# Patient Record
Sex: Female | Born: 1985
Health system: Southern US, Community
[De-identification: ages and names within clinical notes are randomized; demographics above are authoritative.]

## PROBLEM LIST (undated history)

## (undated) DIAGNOSIS — G44229 Chronic tension-type headache, not intractable: Secondary | ICD-10-CM

## (undated) DIAGNOSIS — Z950 Presence of cardiac pacemaker: Secondary | ICD-10-CM

## (undated) DIAGNOSIS — K589 Irritable bowel syndrome without diarrhea: Secondary | ICD-10-CM

## (undated) DIAGNOSIS — Q203 Discordant ventriculoarterial connection: Secondary | ICD-10-CM

## (undated) DIAGNOSIS — Z8774 Personal history of (corrected) congenital malformations of heart and circulatory system: Secondary | ICD-10-CM

## (undated) DIAGNOSIS — G4733 Obstructive sleep apnea (adult) (pediatric): Secondary | ICD-10-CM

## (undated) DIAGNOSIS — N390 Urinary tract infection, site not specified: Secondary | ICD-10-CM

## (undated) DIAGNOSIS — A6920 Lyme disease, unspecified: Secondary | ICD-10-CM

## (undated) HISTORY — PX: CHOLECYSTECTOMY: SHX55

## (undated) HISTORY — PX: CARDIAC SURGERY: SHX584

---

## 2014-01-25 ENCOUNTER — Ambulatory Visit: Payer: Self-pay | Admitting: Family Medicine

## 2014-01-25 LAB — URINALYSIS, COMPLETE

## 2014-01-25 LAB — PREGNANCY, URINE: Pregnancy Test, Urine: NEGATIVE m[IU]/mL

## 2014-01-27 LAB — URINE CULTURE

## 2014-02-04 ENCOUNTER — Ambulatory Visit: Payer: Self-pay

## 2014-02-04 LAB — URINALYSIS, COMPLETE
Bilirubin,UR: NEGATIVE
Blood: NEGATIVE
Glucose,UR: NEGATIVE
Ketone: NEGATIVE
LEUKOCYTE ESTERASE: NEGATIVE
Nitrite: NEGATIVE
Ph: 5.5 (ref 5.0–8.0)
Protein: NEGATIVE
Specific Gravity: 1.025 (ref 1.000–1.030)

## 2014-02-04 LAB — PREGNANCY, URINE: PREGNANCY TEST, URINE: NEGATIVE m[IU]/mL

## 2014-02-06 LAB — URINE CULTURE

## 2014-02-15 ENCOUNTER — Ambulatory Visit: Payer: Self-pay

## 2014-04-02 ENCOUNTER — Ambulatory Visit: Payer: Self-pay | Admitting: Family Medicine

## 2014-11-22 ENCOUNTER — Ambulatory Visit
Admission: EM | Admit: 2014-11-22 | Discharge: 2014-11-22 | Disposition: A | Discharge status: Home / Self Care | Payer: Managed Care, Other (non HMO) | Attending: Family Medicine | Admitting: Family Medicine

## 2014-11-22 DIAGNOSIS — N390 Urinary tract infection, site not specified: Secondary | ICD-10-CM | POA: Diagnosis not present

## 2014-11-22 HISTORY — DX: Presence of cardiac pacemaker: Z95.0

## 2014-11-22 HISTORY — DX: Lyme disease, unspecified: A69.20

## 2014-11-22 LAB — URINALYSIS COMPLETE WITH MICROSCOPIC (ARMC ONLY)
Bilirubin Urine: NEGATIVE
Glucose, UA: NEGATIVE mg/dL
KETONES UR: NEGATIVE mg/dL
Nitrite: NEGATIVE
Protein, ur: 100 mg/dL — AB
Specific Gravity, Urine: 1.025 (ref 1.005–1.030)
pH: 6.5 (ref 5.0–8.0)

## 2014-11-22 LAB — PREGNANCY, URINE: Preg Test, Ur: POSITIVE — AB

## 2014-11-22 MED ORDER — NITROFURANTOIN MONOHYD MACRO 100 MG PO CAPS: 100.0000 mg | ORAL_CAPSULE | Freq: Two times a day (BID) | ORAL | Status: AC

## 2014-11-22 NOTE — ED Notes (Signed)
Pt states "I had a c-section two weeks ago and have urinary pain since, it has gotten worse in last few days, very painful."

## 2014-11-22 NOTE — ED Provider Notes (Signed)
Patient presents today 2 weeks postpartum status post C-section with suprapubic pain that has gotten worse in the last few days. Patient states that the incision feels fine and looks fine. She has had some urinary frequency and dysuria. She does have a history of UTIs. She denies any fever or chills or any nausea or vomiting. She is breast-feeding. She denies being sexually active since her C-section.  ROS: Negative except mentioned above. Vitals as per Epic.   GENERAL: NAD RESP: CTA B CARD: RRR ABD: +BS, mild suprapubic tenderness, no focal area of tenderness, no erythema or drainage from incision, no flank tenderness NEURO: CN II-XII grossly intact   A/P: UTI- UA suggests UTI. Will send urine for culture. HCG is likely positive and trending down due to recent pregnancy. Patient denies being sexually active since delivering. Will treat patient with Macrobid. Patient is to follow-up with OB/GYN or our office if symptoms do persist or worsen as discussed.    Jolene Provost, MD 11/22/14 [phone]

## 2014-11-25 LAB — URINE CULTURE: Culture: >=100000

## 2015-01-01 ENCOUNTER — Ambulatory Visit
Admission: EM | Admit: 2015-01-01 | Discharge: 2015-01-01 | Disposition: A | Discharge status: Home / Self Care | Payer: Managed Care, Other (non HMO) | Attending: Family Medicine | Admitting: Family Medicine

## 2015-01-01 ENCOUNTER — Encounter: Payer: Self-pay | Admitting: *Deleted

## 2015-01-01 DIAGNOSIS — N390 Urinary tract infection, site not specified: Secondary | ICD-10-CM

## 2015-01-01 LAB — URINALYSIS COMPLETE WITH MICROSCOPIC (ARMC ONLY)
GLUCOSE, UA: NEGATIVE mg/dL
NITRITE: NEGATIVE
Protein, ur: 30 mg/dL — AB
SPECIFIC GRAVITY, URINE: 1.025 (ref 1.005–1.030)
pH: 5.5 (ref 5.0–8.0)

## 2015-01-01 MED ORDER — PHENAZOPYRIDINE HCL 200 MG PO TABS: 200.0000 mg | ORAL_TABLET | Freq: Three times a day (TID) | ORAL | Status: DC | PRN

## 2015-01-01 MED ORDER — CIPROFLOXACIN HCL 500 MG PO TABS: 500.0000 mg | ORAL_TABLET | Freq: Two times a day (BID) | ORAL | Status: DC

## 2015-01-01 MED ORDER — FLUCONAZOLE 150 MG PO TABS: 150.0000 mg | ORAL_TABLET | Freq: Once | ORAL | Status: DC

## 2015-01-01 NOTE — ED Notes (Signed)
Pt states that she has flank pain, both sides, mainly on the left side, started about 1 week ago, burning during urination started today

## 2015-01-01 NOTE — Discharge Instructions (Signed)
Dysuria °Dysuria is pain or discomfort while urinating. The pain or discomfort may be felt in the tube that carries urine out of the bladder (urethra) or in the surrounding tissue of the genitals. The pain may also be felt in the groin area, lower abdomen, and lower back. You may have to urinate frequently or have the sudden feeling that you have to urinate (urgency). Dysuria can affect both men and women, but is more common in women. °Dysuria can be caused by many different things, including: °· Urinary tract infection in women. °· Infection of the kidney or bladder. °· Kidney stones or bladder stones. °· Certain sexually transmitted infections (STIs), such as chlamydia. °· Dehydration. °· Inflammation of the vagina. °· Use of certain medicines. °· Use of certain soaps or scented products that cause irritation. °HOME CARE INSTRUCTIONS °Watch your dysuria for any changes. The following actions may help to reduce any discomfort you are feeling: °· Drink enough fluid to keep your urine clear or pale yellow. °· Empty your bladder often. Avoid holding urine for long periods of time. °· After a bowel movement or urination, women should cleanse from front to back, using each tissue only once. °· Empty your bladder after sexual intercourse. °· Take medicines only as directed by your health care provider. °· If you were prescribed an antibiotic medicine, finish it all even if you start to feel better. °· Avoid caffeine, tea, and alcohol. They can irritate the bladder and make dysuria worse. In men, alcohol may irritate the prostate. °· Keep all follow-up visits as directed by your health care provider. This is important. °· If you had any tests done to find the cause of dysuria, it is your responsibility to obtain your test results. Ask the lab or department performing the test when and how you will get your results. Talk with your health care provider if you have any questions about your results. °SEEK MEDICAL CARE  IF: °· You develop pain in your back or sides. °· You have a fever. °· You have nausea or vomiting. °· You have blood in your urine. °· You are not urinating as often as you usually do. °SEEK IMMEDIATE MEDICAL CARE IF: °· You pain is severe and not relieved with medicines. °· You are unable to hold down any fluids. °· You or someone else notices a change in your mental function. °· You have a rapid heartbeat at rest. °· You have shaking or chills. °· You feel extremely weak. °  °This information is not intended to replace advice given to you by your health care provider. Make sure you discuss any questions you have with your health care provider. °  °Document Released: 11/02/2003 Document Revised: 02/24/2014 Document Reviewed: 09/29/2013 °Elsevier Interactive Patient Education ©2016 [employer] ° °Urinary Tract Infection °A urinary tract infection (UTI) can occur any place along the urinary tract. The tract includes the kidneys, ureters, bladder, and urethra. A type of germ called bacteria often causes a UTI. UTIs are often helped with antibiotic medicine.  °HOME CARE  °· If given, take antibiotics as told by your doctor. Finish them even if you start to feel better. °· Drink enough fluids to keep your pee (urine) clear or pale yellow. °· Avoid tea, drinks with caffeine, and bubbly (carbonated) drinks. °· Pee often. Avoid holding your pee in for a long time. °· Pee before and after having sex (intercourse). °· Wipe from front to back after you poop (bowel movement) if you are a   woman. Use each tissue only once. °GET HELP RIGHT AWAY IF:  °· You have back pain. °· You have lower belly (abdominal) pain. °· You have chills. °· You feel sick to your stomach (nauseous). °· You throw up (vomit). °· Your burning or discomfort with peeing does not go away. °· You have a fever. °· Your symptoms are not better in 3 days. °MAKE SURE YOU:  °· Understand these instructions. °· Will watch your condition. °· Will get help right  away if you are not doing well or get worse. °  °This information is not intended to replace advice given to you by your health care provider. Make sure you discuss any questions you have with your health care provider. °  °Document Released: 07/23/2007 Document Revised: 02/24/2014 Document Reviewed: 09/04/2011 °Elsevier Interactive Patient Education ©2016 [employer] ° °

## 2015-01-01 NOTE — ED Provider Notes (Signed)
CSN: [id number]     Arrival date & time 01/01/15  1747 History   First MD Initiated Contact with Patient 01/01/15 1816     Nurses notes were reviewed. Chief Complaint  Patient presents with  . Flank Pain  . Dysuria    Patient is here because of a symptoms of UTI. She states for about a week she's had symptoms of UTI but then the last 2448 hrs. she started having back pain and increased discomfort with dysuria. About a month ago she had a significant UTI that grew out both Escherichia coli and Proteus. She states that she for a while but symptoms seem to recur quickly. She's recently had a baby about 2 weeks ago and she has started having a period and has started becoming sexually active again. She states she's had a history of UTIs in the past as well. (Consider location/radiation/quality/duration/timing/severity/associated sxs/prior Treatment) Patient is a 29 y.o. female presenting with flank pain and dysuria. The history is provided by the patient. No language interpreter was used.  Flank Pain This is a new problem. The current episode started more than 1 week ago. The problem occurs constantly. The problem has been gradually worsening. Associated symptoms include abdominal pain. Pertinent negatives include no chest pain, no headaches and no shortness of breath. She has tried nothing for the symptoms.  Dysuria Pain quality:  Burning Pain severity:  Moderate Duration:  1 week Timing:  Constant Progression:  Worsening Chronicity:  New Relieved by:  Nothing Ineffective treatments:  None tried Urinary symptoms: frequent urination   Associated symptoms: abdominal pain and flank pain   Risk factors: recurrent urinary tract infections and sexually active   Risk factors: no hx of pyelonephritis, no hx of urolithiasis, no kidney transplant, not pregnant, no renal disease and no urinary catheter     Past Medical History  Diagnosis Date  . Pacemaker   . Lyme disease    Past Surgical History   Procedure Laterality Date  . Cholecystectomy    . Cardiac surgery     No family history on file. Social History  Substance Use Topics  . Smoking status: Never Smoker   . Smokeless tobacco: None  . Alcohol Use: No   OB History    No data available     Review of Systems  Respiratory: Negative for shortness of breath.   Cardiovascular: Negative for chest pain.       Because patient has a pacemaker which she states she first was placed when she was 7 this is her second pacemaker and make sure she is having her third pacemaker installed.  Gastrointestinal: Positive for abdominal pain.  Genitourinary: Positive for dysuria and flank pain.  Neurological: Negative for headaches.  All other systems reviewed and are negative.   Allergies  Latex and Vicodin  Home Medications   Prior to Admission medications   Medication Sig Start Date End Date Taking? Authorizing Provider  acetaminophen (TYLENOL) 500 MG tablet Take 1,000 mg by mouth every 6 (six) hours as needed.    Historical Provider, MD  ciprofloxacin (CIPRO) 500 MG tablet Take 1 tablet (500 mg total) by mouth 2 (two) times daily. 01/01/15   Hassan RowanEugene Greysen Devino, MD  fluconazole (DIFLUCAN) 150 MG tablet Take 1 tablet (150 mg total) by mouth once. 01/01/15   Hassan RowanEugene Tamura Lasky, MD  phenazopyridine (PYRIDIUM) 200 MG tablet Take 1 tablet (200 mg total) by mouth 3 (three) times daily as needed for pain. 01/01/15   Hassan RowanEugene Jaycee Mckellips, MD  Meds Ordered and Administered this Visit  Medications - No data to display  BP 113/74 mmHg  Pulse 82  Temp(Src) 97.9 F (36.6 C) (Oral)  Ht  (1.651 m)  Wt 175 lb (79.379 kg)  BMI 29.12 kg/m2  SpO2 98%  LMP 12/23/2014 No data found.   Physical Exam  Constitutional: She is oriented to person, place, and time. She appears well-developed and well-nourished.  HENT:  Head: Normocephalic and atraumatic.  Eyes: Conjunctivae are normal. Pupils are equal, round, and reactive to light.  Abdominal: Soft. Bowel  sounds are normal. She exhibits no distension. There is tenderness in the suprapubic area. There is no CVA tenderness. No hernia.  Musculoskeletal: Normal range of motion.  Neurological: She is oriented to person, place, and time.  Skin: Skin is warm and dry. No rash noted. No erythema.  Psychiatric: She has a normal mood and affect.  Vitals reviewed.   ED Course  Procedures (including critical care time)  Labs Review Labs Reviewed  URINALYSIS COMPLETEWITH MICROSCOPIC ([facility] ONLY) - Abnormal; Notable for the following:    Color, Urine AMBER (*)    APPearance CLOUDY (*)    Bilirubin Urine 1+ (*)    Ketones, ur TRACE (*)    Hgb urine dipstick 3+ (*)    Protein, ur 30 (*)    Leukocytes, UA 2+ (*)    Bacteria, UA MANY (*)    Squamous Epithelial / LPF 6-30 (*)    All other components within normal limits  URINE CULTURE    Imaging Review No results found.   Visual Acuity Review  Right Eye Distance:   Left Eye Distance:   Bilateral Distance:    Right Eye Near:   Left Eye Near:    Bilateral Near:         MDM   1. UTI (lower urinary tract infection)      Due to her recurrence of UTI she states that normally it takes about 8 or 9 days for which she starts feeling the effect of antibiotics she requested Dr. prolonged course we'll place on 15 days. Also Diflucan will be given and Pyridium for the symptoms. Patient's no longer breast-feeding. Also patient is following the proper things such as wiping down going to this bathroom before sexual relations and she states she is gross afterwards and she takes a shower she is sexually active now 29 weeks old. She declined work note. And as far as the blood she states that she is on her period and would also explain the hematuria that was present in the urine as well.     Hassan Rowan, MD 01/01/15 [id number]

## 2015-01-04 LAB — URINE CULTURE: Special Requests: NORMAL

## 2015-01-04 NOTE — ED Notes (Signed)
Final report of urine C&S shows e-coli, sensitive to rx provided day of visit. Called and left message for patient to complete Rx as written, and to call or return for any questions or problems

## 2015-03-14 ENCOUNTER — Ambulatory Visit
Admission: EM | Admit: 2015-03-14 | Discharge: 2015-03-14 | Disposition: A | Discharge status: Home / Self Care | Payer: Managed Care, Other (non HMO) | Attending: Family Medicine | Admitting: Family Medicine

## 2015-03-14 ENCOUNTER — Encounter: Payer: Self-pay | Admitting: Emergency Medicine

## 2015-03-14 DIAGNOSIS — J0100 Acute maxillary sinusitis, unspecified: Secondary | ICD-10-CM

## 2015-03-14 DIAGNOSIS — H6501 Acute serous otitis media, right ear: Secondary | ICD-10-CM | POA: Diagnosis not present

## 2015-03-14 LAB — RAPID INFLUENZA A&B ANTIGENS: Influenza A (ARMC): NOT DETECTED

## 2015-03-14 LAB — RAPID INFLUENZA A&B ANTIGENS (ARMC ONLY): INFLUENZA B (ARMC): NOT DETECTED

## 2015-03-14 MED ORDER — AMOXICILLIN 400 MG/5ML PO SUSR: ORAL | Status: DC

## 2015-03-14 NOTE — ED Notes (Addendum)
Patient c/o cough, chest congestion, HAs, sinus pain and pressure, and bodyaches that started 2 days ago.  Patient denies fevers.

## 2015-03-14 NOTE — ED Provider Notes (Signed)
CSN: [id number]     Arrival date & time 03/14/15  1020 History   First MD Initiated Contact with Patient 03/14/15 1126     Chief Complaint  Patient presents with  . Cough  . Headache  . Facial Pain   (Consider location/radiation/quality/duration/timing/severity/associated sxs/prior Treatment) Patient is a 30 y.o. female presenting with cough, headaches, and URI. The history is provided by the patient.  Cough Associated symptoms: ear pain, fever and headaches   Headache Associated symptoms: congestion, cough, ear pain, facial pain, fever and URI   URI Presenting symptoms: congestion, cough, ear pain, facial pain and fever   Severity:  Moderate Onset quality:  Sudden Duration:  3 weeks Timing:  Constant Progression:  Worsening Chronicity:  New Relieved by:  Nothing Ineffective treatments:  OTC medications Associated symptoms: headaches and sinus pain   Risk factors: not elderly, no chronic respiratory disease, no diabetes mellitus, no immunosuppression, no recent illness, no recent travel and no sick contacts     Past Medical History  Diagnosis Date  . Pacemaker   . Lyme disease    Past Surgical History  Procedure Laterality Date  . Cholecystectomy    . Cardiac surgery    . Cesarean section     History reviewed. No pertinent family history. Social History  Substance Use Topics  . Smoking status: Never Smoker   . Smokeless tobacco: None  . Alcohol Use: No   OB History    No data available     Review of Systems  Constitutional: Positive for fever.  HENT: Positive for congestion and ear pain.   Respiratory: Positive for cough.   Neurological: Positive for headaches.    Allergies  Latex and Vicodin  Home Medications   Prior to Admission medications   Medication Sig Start Date End Date Taking? Authorizing Provider  acetaminophen (TYLENOL) 500 MG tablet Take 1,000 mg by mouth every 6 (six) hours as needed.    Historical Provider, MD  amoxicillin (AMOXIL) 400  MG/5ML suspension 10 ml po bid for 10 days 03/14/15   Payton Mccallum, MD  ciprofloxacin (CIPRO) 500 MG tablet Take 1 tablet (500 mg total) by mouth 2 (two) times daily. 01/01/15   Hassan Rowan, MD  fluconazole (DIFLUCAN) 150 MG tablet Take 1 tablet (150 mg total) by mouth once. 01/01/15   Hassan Rowan, MD  phenazopyridine (PYRIDIUM) 200 MG tablet Take 1 tablet (200 mg total) by mouth 3 (three) times daily as needed for pain. 01/01/15   Hassan Rowan, MD   Meds Ordered and Administered this Visit  Medications - No data to display  BP 119/72 mmHg  Pulse 89  Temp(Src) 98.1 F (36.7 C) (Tympanic)  Resp 16  Ht  (1.651 m)  Wt 185 lb (83.915 kg)  BMI 30.79 kg/m2  SpO2 99%  LMP 02/28/2015 (Approximate) No data found.   Physical Exam  Constitutional: She appears well-developed and well-nourished. No distress.  HENT:  Head: Normocephalic and atraumatic.  Right Ear: External ear and ear canal normal. Tympanic membrane is erythematous and bulging. A middle ear effusion is present.  Left Ear: Tympanic membrane, external ear and ear canal normal.  Nose: Mucosal edema and rhinorrhea present. No nose lacerations, sinus tenderness, nasal deformity, septal deviation or nasal septal hematoma. No epistaxis.  No foreign bodies. Right sinus exhibits maxillary sinus tenderness and frontal sinus tenderness. Left sinus exhibits maxillary sinus tenderness and frontal sinus tenderness.  Mouth/Throat: Uvula is midline, oropharynx is clear and moist and mucous membranes are normal.  No oropharyngeal exudate.  Eyes: Conjunctivae and EOM are normal. Pupils are equal, round, and reactive to light. Right eye exhibits no discharge. Left eye exhibits no discharge. No scleral icterus.  Neck: Normal range of motion. Neck supple. No thyromegaly present.  Cardiovascular: Normal rate, regular rhythm and normal heart sounds.   Pulmonary/Chest: Effort normal and breath sounds normal. No respiratory distress. She has no wheezes.  She has no rales.  Lymphadenopathy:    She has no cervical adenopathy.  Skin: She is not diaphoretic.  Nursing note and vitals reviewed.   ED Course  Procedures (including critical care time)  Labs Review Labs Reviewed  RAPID INFLUENZA A&B ANTIGENS [facility] ONLY)    Imaging Review No results found.   Visual Acuity Review  Right Eye Distance:   Left Eye Distance:   Bilateral Distance:    Right Eye Near:   Left Eye Near:    Bilateral Near:         MDM   1. Acute maxillary sinusitis, recurrence not specified   2. Right acute serous otitis media, recurrence not specified    New Prescriptions   AMOXICILLIN (AMOXIL) 400 MG/5ML SUSPENSION    10 ml po bid for 10 days   1.diagnosis reviewed with patient/parent/guardian/family 2. rx as per orders above; reviewed possible side effects, interactions, risks and benefits  3. Recommend supportive treatment with otc analgesics, increased fluids 4. Follow-up prn if symptoms worsen or don't improve    Payton Mccallum, MD 03/14/15 1153

## 2015-03-14 NOTE — Discharge Instructions (Signed)

## 2015-03-17 ENCOUNTER — Encounter: Payer: Self-pay | Admitting: Emergency Medicine

## 2015-03-17 ENCOUNTER — Ambulatory Visit
Admission: EM | Admit: 2015-03-17 | Discharge: 2015-03-17 | Disposition: A | Discharge status: Home / Self Care | Payer: Managed Care, Other (non HMO) | Attending: Family Medicine | Admitting: Family Medicine

## 2015-03-17 DIAGNOSIS — H6503 Acute serous otitis media, bilateral: Secondary | ICD-10-CM | POA: Diagnosis not present

## 2015-03-17 DIAGNOSIS — K529 Noninfective gastroenteritis and colitis, unspecified: Secondary | ICD-10-CM | POA: Diagnosis not present

## 2015-03-17 DIAGNOSIS — J0100 Acute maxillary sinusitis, unspecified: Secondary | ICD-10-CM | POA: Diagnosis not present

## 2015-03-17 MED ORDER — SUPRAX 400 MG PO TABS: 400.0000 mg | ORAL_TABLET | Freq: Every day | ORAL | Status: DC

## 2015-03-17 MED ORDER — HYDROCOD POLST-CPM POLST ER 10-8 MG/5ML PO SUER: 5.0000 mL | Freq: Two times a day (BID) | ORAL | Status: DC | PRN

## 2015-03-17 MED ORDER — BENZONATATE 200 MG PO CAPS: 200.0000 mg | ORAL_CAPSULE | Freq: Three times a day (TID) | ORAL | Status: DC | PRN

## 2015-03-17 MED ORDER — ONDANSETRON 8 MG PO TBDP: 8.0000 mg | ORAL_TABLET | Freq: Three times a day (TID) | ORAL | Status: DC | PRN

## 2015-03-17 NOTE — Discharge Instructions (Signed)
Serous Otitis Media Serous otitis media is fluid in the middle ear space. This space contains the bones for hearing and air. Air in the middle ear space helps to transmit sound.  The air gets there through the eustachian tube. This tube goes from the back of the nose (nasopharynx) to the middle ear space. It keeps the pressure in the middle ear the same as the outside world. It also helps to drain fluid from the middle ear space. CAUSES  Serous otitis media occurs when the eustachian tube gets blocked. Blockage can come from:  Ear infections.  Colds and other upper respiratory infections.  Allergies.  Irritants such as cigarette smoke.  Sudden changes in air pressure (such as descending in an airplane).  Enlarged adenoids.  A mass in the nasopharynx. During colds and upper respiratory infections, the middle ear space can become temporarily filled with fluid. This can happen after an ear infection also. Once the infection clears, the fluid will generally drain out of the ear through the eustachian tube. If it does not, then serous otitis media occurs. SIGNS AND SYMPTOMS   Hearing loss.  A feeling of fullness in the ear, without pain.  Young children may not show any symptoms but may show slight behavioral changes, such as agitation, ear pulling, or crying. DIAGNOSIS  Serous otitis media is diagnosed by an ear exam. Tests may be done to check on the movement of the eardrum. Hearing exams may also be done. TREATMENT  The fluid most often goes away without treatment. If allergy is the cause, allergy treatment may be helpful. Fluid that persists for several months may require minor surgery. A small tube is placed in the eardrum to:  Drain the fluid.  Restore the air in the middle ear space. In certain situations, antibiotic medicines are used to avoid surgery. Surgery may be done to remove enlarged adenoids (if this is the cause). HOME CARE INSTRUCTIONS   Keep children away from  tobacco smoke.  Keep all follow-up visits as directed by your health care provider. SEEK MEDICAL CARE IF:   Your hearing is not better in 3 months.  Your hearing is worse.  You have ear pain.  You have drainage from the ear.  You have dizziness.  You have serous otitis media only in one ear or have any bleeding from your nose (epistaxis).  You notice a lump on your neck. MAKE SURE YOU:  Understand these instructions.   Will watch your condition.   Will get help right away if you are not doing well or get worse.    This information is not intended to replace advice given to you by your health care provider. Make sure you discuss any questions you have with your health care provider.   Document Released: 04/26/2003 Document Revised: 02/24/2014 Document Reviewed: 08/31/2012 Elsevier Interactive Patient Education 2016 [employer]  Sinusitis, Adult Sinusitis is redness, soreness, and puffiness (inflammation) of the air pockets in the bones of your face (sinuses). The redness, soreness, and puffiness can cause air and mucus to get trapped in your sinuses. This can allow germs to grow and cause an infection.  HOME CARE   Drink enough fluids to keep your pee (urine) clear or pale yellow.  Use a humidifier in your home.  Run a hot shower to create steam in the bathroom. Sit in the bathroom with the door closed. Breathe in the steam 3-4 times a day.  Put a warm, moist washcloth on your face 3-4  times a day, or as told by your doctor.  Use salt water sprays (saline sprays) to wet the thick fluid in your nose. This can help the sinuses drain.  Only take medicine as told by your doctor. GET HELP RIGHT AWAY IF:   Your pain gets worse.  You have very bad headaches.  You are sick to your stomach (nauseous).  You throw up (vomit).  You are very sleepy (drowsy) all the time.  Your face is puffy (swollen).  Your vision changes.  You have a stiff neck.  You have  trouble breathing. MAKE SURE YOU:   Understand these instructions.  Will watch your condition.  Will get help right away if you are not doing well or get worse.   This information is not intended to replace advice given to you by your health care provider. Make sure you discuss any questions you have with your health care provider.   Document Released: 07/23/2007 Document Revised: 02/24/2014 Document Reviewed: 09/09/2011 Elsevier Interactive Patient Education 2016 [employer]   Upper Respiratory Infection, Adult Most upper respiratory infections (URIs) are caused by a virus. A URI affects the nose, throat, and upper air passages. The most common type of URI is often called "the common cold." HOME CARE   Take medicines only as told by your doctor.  Gargle warm saltwater or take cough drops to comfort your throat as told by your doctor.  Use a warm mist humidifier or inhale steam from a shower to increase air moisture. This may make it easier to breathe.  Drink enough fluid to keep your pee (urine) clear or pale yellow.  Eat soups and other clear broths.  Have a healthy diet.  Rest as needed.  Go back to work when your fever is gone or your doctor says it is okay.  You may need to stay home longer to avoid giving your URI to others.  You can also wear a face mask and wash your hands often to prevent spread of the virus.  Use your inhaler more if you have asthma.  Do not use any tobacco products, including cigarettes, chewing tobacco, or electronic cigarettes. If you need help quitting, ask your doctor. GET HELP IF:  You are getting worse, not better.  Your symptoms are not helped by medicine.  You have chills.  You are getting more short of breath.  You have brown or red mucus.  You have yellow or brown discharge from your nose.  You have pain in your face, especially when you bend forward.  You have a fever.  You have puffy (swollen) neck glands.  You  have pain while swallowing.  You have white areas in the back of your throat. GET HELP RIGHT AWAY IF:   You have very bad or constant:  Headache.  Ear pain.  Pain in your forehead, behind your eyes, and over your cheekbones (sinus pain).  Chest pain.  You have long-lasting (chronic) lung disease and any of the following:  Wheezing.  Long-lasting cough.  Coughing up blood.  A change in your usual mucus.  You have a stiff neck.  You have changes in your:  Vision.  Hearing.  Thinking.  Mood. MAKE SURE YOU:   Understand these instructions.  Will watch your condition.  Will get help right away if you are not doing well or get worse.   This information is not intended to replace advice given to you by your health care provider. Make sure you discuss  any questions you have with your health care provider.   Document Released: 07/23/2007 Document Revised: 06/20/2014 Document Reviewed: 05/11/2013 Elsevier Interactive Patient Education 2016 [employer] Gastritis, Adult Gastritis is soreness and puffiness (inflammation) of the lining of the stomach. If you do not get help, gastritis can cause bleeding and sores (ulcers) in the stomach. HOME CARE  Only take medicine as told by your doctor. If you were given antibiotic medicines, take them as told. Finish the medicines even if you start to feel better. Drink enough fluids to keep your pee (urine) clear or pale yellow. Avoid foods and drinks that make your problems worse. Foods you may want to avoid include: Caffeine or alcohol. Chocolate. Mint. Garlic and onions. Spicy foods. Citrus fruits, including oranges, lemons, or limes. Food containing tomatoes, including sauce, chili, salsa, and pizza. Fried and fatty foods. Eat small meals throughout the day instead of large meals. GET HELP RIGHT AWAY IF:  You have black or dark red poop (stools). You throw up (vomit) blood. It may look like coffee grounds. You cannot  keep fluids down. Your belly (abdominal) pain gets worse. You have a fever. You do not feel better after 1 week. You have any other questions or concerns. MAKE SURE YOU:  Understand these instructions. Will watch your condition. Will get help right away if you are not doing well or get worse.   This information is not intended to replace advice given to you by your health care provider. Make sure you discuss any questions you have with your health care provider.   Document Released: 07/23/2007 Document Revised: 04/28/2011 Document Reviewed: 03/19/2011 Elsevier Interactive Patient Education [employer]

## 2015-03-17 NOTE — ED Provider Notes (Signed)
CSN: [id number]     Arrival date & time 03/17/15  2956 History   First MD Initiated Contact with Patient 03/17/15 [phone]    Nurses notes were reviewed.  Chief Complaint  Patient presents with  . Facial Pain  . Otalgia   Patient was seen here on Wednesday by Dr. Izell [place] for right ear infection. Since then patient's right ear is the worse. States he feels is more pressure in her left ear also has developed pressure as well. She reports coughing has gotten worse and she subsequently developed a GI bug with diarrhea and is also had nausea and vomiting as well. Overall patient feels miserable. She states that her daughter's coughing and that her son has developed a bilateral ear infection with single infection as well in the past week. Patient had a C-section about 5 months ago for further daughter cholecystectomy and she also had cardiac surgery with a pacemaker was put in she was a child. She also has history of onset disease but she never smoked. No significant family medical history other than the current illnesses going on in the family.    (Consider location/radiation/quality/duration/timing/severity/associated sxs/prior Treatment) Patient is a 30 y.o. female presenting with ear pain. The history is provided by the patient. No language interpreter was used.  Otalgia Location:  Bilateral Quality:  Aching and pressure Duration:  1 week Timing:  Constant Progression:  Worsening Chronicity:  New Relieved by:  Nothing Ineffective treatments: prescription medication. Associated symptoms: congestion, cough, diarrhea, rhinorrhea and vomiting   Congestion:    Location:  Nasal Cough:    Cough characteristics:  Non-productive and hacking   Chronicity:  New Risk factors: no recent travel and no chronic ear infection     Past Medical History  Diagnosis Date  . Pacemaker   . Lyme disease    Past Surgical History  Procedure Laterality Date  . Cholecystectomy    . Cardiac surgery    . Cesarean  section     No family history on file. Social History  Substance Use Topics  . Smoking status: Never Smoker   . Smokeless tobacco: None  . Alcohol Use: No   OB History    No data available     Review of Systems  HENT: Positive for congestion, ear pain, rhinorrhea and sinus pressure.   Respiratory: Positive for cough.   Cardiovascular: Negative for chest pain.  Gastrointestinal: Positive for nausea, vomiting and diarrhea.  All other systems reviewed and are negative.   Allergies  Latex and Vicodin  Home Medications   Prior to Admission medications   Medication Sig Start Date End Date Taking? Authorizing Provider  acetaminophen (TYLENOL) 500 MG tablet Take 1,000 mg by mouth every 6 (six) hours as needed.   Yes Historical Provider, MD  amoxicillin (AMOXIL) 400 MG/5ML suspension 10 ml po bid for 10 days 03/14/15  Yes Payton Mccallum, MD  chlorpheniramine-HYDROcodone (TUSSIONEX PENNKINETIC ER) 10-8 MG/5ML SUER Take 5 mLs by mouth every 12 (twelve) hours as needed for cough. 03/17/15   Hassan Rowan, MD  ciprofloxacin (CIPRO) 500 MG tablet Take 1 tablet (500 mg total) by mouth 2 (two) times daily. 01/01/15   Hassan Rowan, MD  fluconazole (DIFLUCAN) 150 MG tablet Take 1 tablet (150 mg total) by mouth once. 01/01/15   Hassan Rowan, MD  ondansetron (ZOFRAN ODT) 8 MG disintegrating tablet Take 1 tablet (8 mg total) by mouth every 8 (eight) hours as needed for nausea or vomiting. 03/17/15   Hassan Rowan, MD  phenazopyridine (PYRIDIUM) 200 MG tablet Take 1 tablet (200 mg total) by mouth 3 (three) times daily as needed for pain. 01/01/15   Hassan Rowan, MD  SUPRAX 400 MG tablet Take 1 tablet (400 mg total) by mouth daily. May substitute Suprax 200 mg chewable 2 tablets a day #20 per patient's preference and cost consideration neither are price affordable to please contact our office for other alternatives 03/17/15   Hassan Rowan, MD   Meds Ordered and Administered this Visit  Medications - No data to  display  BP 112/70 mmHg  Pulse 75  Temp(Src) 97.8 F (36.6 C) (Tympanic)  Resp 16  Ht  (1.651 m)  Wt 184 lb (83.462 kg)  BMI 30.62 kg/m2  SpO2 98%  LMP 02/28/2015 (Approximate) No data found.   Physical Exam  Constitutional: She is oriented to person, place, and time. She appears well-developed.  HENT:  Head: Normocephalic.  Right Ear: Hearing, external ear and ear canal normal. Tympanic membrane is bulging.  Left Ear: Hearing, external ear and ear canal normal. Tympanic membrane is bulging.  Nose: Mucosal edema and rhinorrhea present. Right sinus exhibits maxillary sinus tenderness. Left sinus exhibits maxillary sinus tenderness.  Mouth/Throat: Uvula is midline. Posterior oropharyngeal erythema present.  Eyes: Pupils are equal, round, and reactive to light.  Neck: Normal range of motion. Neck supple.  Cardiovascular: Normal rate, regular rhythm and normal heart sounds.   Pulmonary/Chest: Effort normal and breath sounds normal.  Musculoskeletal: Normal range of motion. She exhibits no tenderness.  Neurological: She is alert and oriented to person, place, and time.  Skin: Skin is warm and dry.  Psychiatric: She has a normal mood and affect. Her behavior is normal.  Vitals reviewed.   ED Course  Procedures (including critical care time)  Labs Review Labs Reviewed - No data to display  Imaging Review No results found.   Visual Acuity Review  Right Eye Distance:   Left Eye Distance:   Bilateral Distance:    Right Eye Near:   Left Eye Near:    Bilateral Near:         MDM   1. Acute maxillary sinusitis, recurrence not specified   2. Bilateral acute serous otitis media, recurrence not specified   3. Gastroenteritis    We'll place patient on Tussionex 1 teaspoon twice a day. Hold off Allegra-D since she has a pacemaker. We'll place on Suprax 400 mg 1 tablet daily or the 200 to take 2 chewable tablets daily. If neither of those covered by insurance unless  from skin call back also the tests Hopefully help with the gastroenteritis and the diarrhea by slowing of GI transport. And we'll place on Zofran for nausea if needed. Follow-up PCP of choice in about 1-2 weeks if needed.. The Augmentin and use Tylenol only as needed.  Note: This dictation was prepared with Dragon dictation along with smaller phrase technology. Any transcriptional errors that result from this process are unintentional.  Hassan Rowan, MD 03/17/15 1004

## 2015-03-17 NOTE — ED Notes (Signed)
Seen on Wednesday for sinus and ear infection. Not feeling any better. Currently taking Amoxcillin

## 2015-03-28 ENCOUNTER — Ambulatory Visit
Admission: EM | Admit: 2015-03-28 | Discharge: 2015-03-28 | Disposition: A | Discharge status: Home / Self Care | Payer: Managed Care, Other (non HMO) | Attending: Family Medicine | Admitting: Family Medicine

## 2015-03-28 ENCOUNTER — Encounter: Payer: Self-pay | Admitting: Emergency Medicine

## 2015-03-28 DIAGNOSIS — R42 Dizziness and giddiness: Secondary | ICD-10-CM | POA: Diagnosis not present

## 2015-03-28 DIAGNOSIS — H6983 Other specified disorders of Eustachian tube, bilateral: Secondary | ICD-10-CM

## 2015-03-28 MED ORDER — METHYLPREDNISOLONE SODIUM SUCC 125 MG IJ SOLR
125.0000 mg | Freq: Once | INTRAMUSCULAR | Status: AC
  Administered 2015-03-28: 125 mg via INTRAMUSCULAR

## 2015-03-28 MED ORDER — LORATADINE 10 MG PO TABS: 10.0000 mg | ORAL_TABLET | Freq: Every day | ORAL | Status: DC

## 2015-03-28 MED ORDER — MECLIZINE HCL 25 MG PO TABS: ORAL_TABLET | ORAL | Status: DC

## 2015-03-28 MED ORDER — PREDNISONE 10 MG (21) PO TBPK: ORAL_TABLET | ORAL | Status: DC

## 2015-03-28 NOTE — ED Notes (Signed)
Patient was seen last week and dxed with bilateral ear infection and vertigo.  Pt. States her ears are still painful and still having vertigo

## 2015-03-28 NOTE — Discharge Instructions (Signed)
Benign Positional Vertigo Vertigo is the feeling that you or your surroundings are moving when they are not. Benign positional vertigo is the most common form of vertigo. The cause of this condition is not serious (is benign). This condition is triggered by certain movements and positions (is positional). This condition can be dangerous if it occurs while you are doing something that could endanger you or others, such as driving.  CAUSES In many cases, the cause of this condition is not known. It may be caused by a disturbance in an area of the inner ear that helps your brain to sense movement and balance. This disturbance can be caused by a viral infection (labyrinthitis), head injury, or repetitive motion. RISK FACTORS This condition is more likely to develop in:  Women.  People who are 50 years of age or older. SYMPTOMS Symptoms of this condition usually happen when you move your head or your eyes in different directions. Symptoms may start suddenly, and they usually last for less than a minute. Symptoms may include:  Loss of balance and falling.  Feeling like you are spinning or moving.  Feeling like your surroundings are spinning or moving.  Nausea and vomiting.  Blurred vision.  Dizziness.  Involuntary eye movement (nystagmus). Symptoms can be mild and cause only slight annoyance, or they can be severe and interfere with daily life. Episodes of benign positional vertigo may return (recur) over time, and they may be triggered by certain movements. Symptoms may improve over time. DIAGNOSIS This condition is usually diagnosed by medical history and a physical exam of the head, neck, and ears. You may be referred to a health care provider who specializes in ear, nose, and throat (ENT) problems (otolaryngologist) or a provider who specializes in disorders of the nervous system (neurologist). You may have additional testing, including:  MRI.  A CT scan.  Eye movement tests. Your  health care provider may ask you to change positions quickly while he or she watches you for symptoms of benign positional vertigo, such as nystagmus. Eye movement may be tested with an electronystagmogram (ENG), caloric stimulation, the Dix-Hallpike test, or the roll test.  An electroencephalogram (EEG). This records electrical activity in your brain.  Hearing tests. TREATMENT Usually, your health care provider will treat this by moving your head in specific positions to adjust your inner ear back to normal. Surgery may be needed in severe cases, but this is rare. In some cases, benign positional vertigo may resolve on its own in 2-4 weeks. HOME CARE INSTRUCTIONS Safety  Move slowly.Avoid sudden body or head movements.  Avoid driving.  Avoid operating heavy machinery.  Avoid doing any tasks that would be dangerous to you or others if a vertigo episode would occur.  If you have trouble walking or keeping your balance, try using a cane for stability. If you feel dizzy or unstable, sit down right away.  Return to your normal activities as told by your health care provider. Ask your health care provider what activities are safe for you. General Instructions  Take over-the-counter and prescription medicines only as told by your health care provider.  Avoid certain positions or movements as told by your health care provider.  Drink enough fluid to keep your urine clear or pale yellow.  Keep all follow-up visits as told by your health care provider. This is important. SEEK MEDICAL CARE IF:  You have a fever.  Your condition gets worse or you develop new symptoms.  Your family or friends   notice any behavioral changes.  Your nausea or vomiting gets worse.  You have numbness or a "pins and needles" sensation. SEEK IMMEDIATE MEDICAL CARE IF:  You have difficulty speaking or moving.  You are always dizzy.  You faint.  You develop severe headaches.  You have weakness in your  legs or arms.  You have changes in your hearing or vision.  You develop a stiff neck.  You develop sensitivity to light.   This information is not intended to replace advice given to you by your health care provider. Make sure you discuss any questions you have with your health care provider.   Document Released: 11/11/2005 Document Revised: 10/25/2014 Document Reviewed: 05/29/2014 Elsevier Interactive Patient Education 2016 [employer] Inc[employer] Barotitis Media Barotitis media is inflammation of your middle ear. This occurs when the auditory tube (eustachian tube) leading from the back of your nose (nasopharynx) to your eardrum is blocked. This blockage may result from a cold, environmental allergies, or an upper respiratory infection. Unresolved barotitis media may lead to damage or hearing loss (barotrauma), which may become permanent. HOME CARE INSTRUCTIONS   Use medicines as recommended by your health care provider. Over-the-counter medicines will help unblock the canal and can help during times of air travel.  Do not put anything into your ears to clean or unplug them. Eardrops will not be helpful.  Do not swim, dive, or fly until your health care provider says it is all right to do so. If these activities are necessary, chewing gum with frequent, forceful swallowing may help. It is also helpful to hold your nose and gently blow to pop your ears for equalizing pressure changes. This forces air into the eustachian tube.  Only take over-the-counter or prescription medicines for pain, discomfort, or fever as directed by your health care provider.  A decongestant may be helpful in decongesting the middle ear and make pressure equalization easier. SEEK MEDICAL CARE IF:  You experience a serious form of dizziness in which you feel as if the room is spinning and you feel nauseated (vertigo).  Your symptoms only involve one ear. SEEK IMMEDIATE MEDICAL CARE IF:   You develop a severe headache,  dizziness, or severe ear pain.  You have bloody or pus-like drainage from your ears.  You develop a fever.  Your problems do not improve or become worse. MAKE SURE YOU:   Understand these instructions.  Will watch your condition.  Will get help right away if you are not doing well or get worse.   This information is not intended to replace advice given to you by your health care provider. Make sure you discuss any questions you have with your health care provider.   Document Released: 02/01/2000 Document Revised: 11/24/2012 Document Reviewed: 08/31/2012 Elsevier Interactive Patient Education 2016 [employer]  

## 2015-03-28 NOTE — ED Provider Notes (Signed)
CSN: [id number]     Arrival date & time 03/28/15  1743 History   First MD Initiated Contact with Patient 03/28/15 1933    Nurses notes were reviewed. Chief Complaint  Patient presents with  . Otalgia   patient is here because of otalgia. She reports after being treated with Suprax things got better to a point pain in ears went from 9-10/10 to about a 5/10. She states that things just seemed stuck and that while her vertigo has also improved that also seemed stuck she is concerned about driving with the vertigo and aspirating a hamper in her commute she reports no fever but frustrated with her recurrent pain. Should be noted the patient's had a pacemaker we did not put her on a topical decongestant because of that. She's had no more fever she does not smoke and has no pertinent family medical history for this problem.    (Consider location/radiation/quality/duration/timing/severity/associated sxs/prior Treatment) Patient is a 30 y.o. female presenting with ear pain. The history is provided by the patient. No language interpreter was used.  Otalgia Location:  Bilateral Quality:  Pressure Severity:  Moderate Timing:  Constant Chronicity:  New Relieved by:  Nothing Ineffective treatments:  None tried Associated symptoms: congestion   Associated symptoms: no rhinorrhea     Past Medical History  Diagnosis Date  . Pacemaker   . Lyme disease    Past Surgical History  Procedure Laterality Date  . Cholecystectomy    . Cardiac surgery    . Cesarean section     History reviewed. No pertinent family history. Social History  Substance Use Topics  . Smoking status: Never Smoker   . Smokeless tobacco: None  . Alcohol Use: No   OB History    No data available     Review of Systems  HENT: Positive for congestion and ear pain. Negative for rhinorrhea.   Neurological: Positive for dizziness.  All other systems reviewed and are negative.   Allergies  Latex and Vicodin  Home  Medications   Prior to Admission medications   Medication Sig Start Date End Date Taking? Authorizing Provider  amoxicillin (AMOXIL) 400 MG/5ML suspension 10 ml po bid for 10 days 03/14/15  Yes Payton Mccallum, MD  fluconazole (DIFLUCAN) 150 MG tablet Take 1 tablet (150 mg total) by mouth once. 01/01/15  Yes Hassan Rowan, MD  SUPRAX 400 MG tablet Take 1 tablet (400 mg total) by mouth daily. May substitute Suprax 200 mg chewable 2 tablets a day #20 per patient's preference and cost consideration neither are price affordable to please contact our office for other alternatives 03/17/15  Yes Hassan Rowan, MD  acetaminophen (TYLENOL) 500 MG tablet Take 1,000 mg by mouth every 6 (six) hours as needed.    Historical Provider, MD  chlorpheniramine-HYDROcodone (TUSSIONEX PENNKINETIC ER) 10-8 MG/5ML SUER Take 5 mLs by mouth every 12 (twelve) hours as needed for cough. 03/17/15   Hassan Rowan, MD  ciprofloxacin (CIPRO) 500 MG tablet Take 1 tablet (500 mg total) by mouth 2 (two) times daily. 01/01/15   Hassan Rowan, MD  loratadine (CLARITIN) 10 MG tablet Take 1 tablet (10 mg total) by mouth daily. Take 1 tablet in the morning. As needed for itching. 03/28/15   Hassan Rowan, MD  meclizine (ANTIVERT) 25 MG tablet Take 1 1:30 tablet up to 3 times a day for dizziness 03/28/15   Hassan Rowan, MD  ondansetron (ZOFRAN ODT) 8 MG disintegrating tablet Take 1 tablet (8 mg total) by mouth every 8 (  eight) hours as needed for nausea or vomiting. 03/17/15   Hassan Rowan, MD  phenazopyridine (PYRIDIUM) 200 MG tablet Take 1 tablet (200 mg total) by mouth 3 (three) times daily as needed for pain. 01/01/15   Hassan Rowan, MD  predniSONE (STERAPRED UNI-PAK 21 TAB) 10 MG (21) TBPK tablet 6 tabs day 1 and 2, 5 tabs day 3 and 4, 4 tabs day 5 and 6, 3 tabs day 7 and 8, 2 tabs day 9 and 10, 1 tab day 11 and 12. Take orally 03/28/15   Hassan Rowan, MD   Meds Ordered and Administered this Visit   Medications  methylPREDNISolone sodium succinate  (SOLU-MEDROL) 125 mg/2 mL injection 125 mg (not administered)    BP 113/74 mmHg  Pulse 69  Temp(Src) 98 F (36.7 C) (Oral)  Resp 18  Ht  (1.651 m)  Wt 180 lb (81.647 kg)  BMI 29.95 kg/m2  SpO2 99%  LMP 02/28/2015 (Approximate) No data found.   Physical Exam  Constitutional: She is oriented to person, place, and time. She appears well-developed and well-nourished.  HENT:  Head: Normocephalic and atraumatic.  Right Ear: Hearing, external ear and ear canal normal. Tympanic membrane is bulging.  Left Ear: Hearing, external ear and ear canal normal. Tympanic membrane is bulging.  Nose: Mucosal edema present. Right sinus exhibits maxillary sinus tenderness. Left sinus exhibits maxillary sinus tenderness.  Eyes: Pupils are equal, round, and reactive to light.  Neck: Normal range of motion. Neck supple.  Musculoskeletal: Normal range of motion. She exhibits no tenderness.  Lymphadenopathy:    She has cervical adenopathy.  Neurological: She is alert and oriented to person, place, and time.  Skin: Skin is warm and dry.  Psychiatric: She has a normal mood and affect.  Vitals reviewed.   ED Course  Procedures (including critical care time)  Labs Review Labs Reviewed - No data to display  Imaging Review No results found.   Visual Acuity Review  Right Eye Distance:   Left Eye Distance:   Bilateral Distance:    Right Eye Near:   Left Eye Near:    Bilateral Near:         MDM   1. Eustachian tube dysfunction, bilateral   2. Vertigo    This time the ears show bulging of the TMs but there is no redness associated with it. Both TMs are clear. She does have tenderness behind the ears consistent with eustachian tube dysfunction. 0.2 about states tube dysfunction however need to try to open the eustachian tube. We had patient do Valsalva maneuver which did help relieve some of the pain and discomfort. Will going to continue to treat the eustachian tube will give 125 mg by  Medrol I am putting her 12 day course of prednisone semi-usual 6 day course for ear infections or sinus infections the persistent because we can't use decongestants on her. Also add antihistamines since no longer worried about infection or infectious state and placed on Antivert 25 mg per expectoration just take a half tablet if Antivert makes a little dizzy and groggy and hopefully this should do the trick we did talk and if things are not better I will flush will need to go see the is no state that Dr. her son has seen ears nose and throat doctor so she has one in mind she can see if not better in 2 weeks. Will give a work note for today dyskinesis date.    Hassan Rowan, MD 03/28/15 2020

## 2015-04-25 ENCOUNTER — Ambulatory Visit
Admission: EM | Admit: 2015-04-25 | Discharge: 2015-04-25 | Disposition: A | Discharge status: Home / Self Care | Payer: Managed Care, Other (non HMO) | Attending: Family Medicine | Admitting: Family Medicine

## 2015-04-25 ENCOUNTER — Ambulatory Visit (INDEPENDENT_AMBULATORY_CARE_PROVIDER_SITE_OTHER)
Admit: 2015-04-25 | Discharge: 2015-04-25 | Disposition: A | Discharge status: Home / Self Care | Payer: Managed Care, Other (non HMO) | Attending: Family Medicine | Admitting: Family Medicine

## 2015-04-25 ENCOUNTER — Encounter: Payer: Self-pay | Admitting: *Deleted

## 2015-04-25 DIAGNOSIS — B349 Viral infection, unspecified: Secondary | ICD-10-CM

## 2015-04-25 LAB — COMPREHENSIVE METABOLIC PANEL
ALT: 13 U/L — ABNORMAL LOW (ref 14–54)
AST: 15 U/L (ref 15–41)
Albumin: 4.8 g/dL (ref 3.5–5.0)
Alkaline Phosphatase: 84 U/L (ref 38–126)
Anion gap: 5 (ref 5–15)
BUN: 14 mg/dL (ref 6–20)
CHLORIDE: 103 mmol/L (ref 101–111)
CO2: 26 mmol/L (ref 22–32)
Calcium: 8.3 mg/dL — ABNORMAL LOW (ref 8.9–10.3)
Creatinine, Ser: 0.69 mg/dL (ref 0.44–1.00)
Glucose, Bld: 100 mg/dL — ABNORMAL HIGH (ref 65–99)
POTASSIUM: 3.7 mmol/L (ref 3.5–5.1)
SODIUM: 134 mmol/L — AB (ref 135–145)
Total Bilirubin: 1.2 mg/dL (ref 0.3–1.2)
Total Protein: 8 g/dL (ref 6.5–8.1)

## 2015-04-25 LAB — CBC WITH DIFFERENTIAL/PLATELET
Basophils Absolute: 0 10*3/uL (ref 0–0.1)
Basophils Relative: 0 %
EOS ABS: 0 10*3/uL (ref 0–0.7)
Eosinophils Relative: 1 %
HCT: 42.6 % (ref 35.0–47.0)
HEMOGLOBIN: 14.2 g/dL (ref 12.0–16.0)
LYMPHS ABS: 1.2 10*3/uL (ref 1.0–3.6)
Lymphocytes Relative: 16 %
MCH: 28.1 pg (ref 26.0–34.0)
MCHC: 33.3 g/dL (ref 32.0–36.0)
MCV: 84.3 fL (ref 80.0–100.0)
Monocytes Absolute: 0.5 10*3/uL (ref 0.2–0.9)
Monocytes Relative: 7 %
NEUTROS PCT: 76 %
Neutro Abs: 5.8 10*3/uL (ref 1.4–6.5)
Platelets: 270 10*3/uL (ref 150–440)
RBC: 5.05 MIL/uL (ref 3.80–5.20)
RDW: 14 % (ref 11.5–14.5)
WBC: 7.6 10*3/uL (ref 3.6–11.0)

## 2015-04-25 LAB — URINALYSIS COMPLETE WITH MICROSCOPIC (ARMC ONLY)
BILIRUBIN URINE: NEGATIVE
GLUCOSE, UA: NEGATIVE mg/dL
KETONES UR: NEGATIVE mg/dL
Leukocytes, UA: NEGATIVE
NITRITE: NEGATIVE
SPECIFIC GRAVITY, URINE: 1.02 (ref 1.005–1.030)
pH: 7 (ref 5.0–8.0)

## 2015-04-25 LAB — AMYLASE: AMYLASE: 44 U/L (ref 28–100)

## 2015-04-25 LAB — LIPASE, BLOOD: LIPASE: 26 U/L (ref 11–51)

## 2015-04-25 LAB — PREGNANCY, URINE: PREG TEST UR: NEGATIVE

## 2015-04-25 NOTE — ED Provider Notes (Signed)
CSN: [id number]     Arrival date & time 04/25/15  0856 History   First MD Initiated Contact with Patient 04/25/15 0919     Chief Complaint  Patient presents with  . Abdominal Pain  . Emesis  . Diarrhea   (Consider location/radiation/quality/duration/timing/severity/associated sxs/prior Treatment) HPI: Patient presents today with symptoms of epigastric and left upper quadrant pain. Patient states that she is had vomiting and diarrhea for proximally a week. She has a history of a cholecystectomy. She denies any history of nephrolithiasis. She admits that she has had a history of pancreatitis which this does feel like. She denies any alcohol use recently. She denies any bloody diarrhea, dysuria, hematuria, melena. She denies any chest pain, shortness of breath, sore throat, cough, severe headache.  Past Medical History  Diagnosis Date  . Pacemaker   . Lyme disease    Past Surgical History  Procedure Laterality Date  . Cholecystectomy    . Cardiac surgery    . Cesarean section     History reviewed. No pertinent family history. Social History  Substance Use Topics  . Smoking status: Never Smoker   . Smokeless tobacco: None  . Alcohol Use: No   OB History    No data available     Review of Systems: Negative except mentioned above.   Allergies  Latex; Oxycodone; and Vicodin  Home Medications   Prior to Admission medications   Medication Sig Start Date End Date Taking? Authorizing Provider  acetaminophen (TYLENOL) 500 MG tablet Take 1,000 mg by mouth every 6 (six) hours as needed.   Yes Historical Provider, MD  meclizine (ANTIVERT) 25 MG tablet Take 1 1:30 tablet up to 3 times a day for dizziness 03/28/15  Yes Hassan Rowan, MD  sertraline (ZOLOFT) 50 MG tablet Take 50 mg by mouth 1 day or 1 dose.   Yes Historical Provider, MD  amoxicillin (AMOXIL) 400 MG/5ML suspension 10 ml po bid for 10 days 03/14/15   Payton Mccallum, MD  chlorpheniramine-HYDROcodone [facility] ER) 10-8  MG/5ML SUER Take 5 mLs by mouth every 12 (twelve) hours as needed for cough. 03/17/15   Hassan Rowan, MD  ciprofloxacin (CIPRO) 500 MG tablet Take 1 tablet (500 mg total) by mouth 2 (two) times daily. 01/01/15   Hassan Rowan, MD  fluconazole (DIFLUCAN) 150 MG tablet Take 1 tablet (150 mg total) by mouth once. 01/01/15   Hassan Rowan, MD  loratadine (CLARITIN) 10 MG tablet Take 1 tablet (10 mg total) by mouth daily. Take 1 tablet in the morning. As needed for itching. 03/28/15   Hassan Rowan, MD  ondansetron (ZOFRAN ODT) 8 MG disintegrating tablet Take 1 tablet (8 mg total) by mouth every 8 (eight) hours as needed for nausea or vomiting. 03/17/15   Hassan Rowan, MD  phenazopyridine (PYRIDIUM) 200 MG tablet Take 1 tablet (200 mg total) by mouth 3 (three) times daily as needed for pain. 01/01/15   Hassan Rowan, MD  predniSONE (STERAPRED UNI-PAK 21 TAB) 10 MG (21) TBPK tablet 6 tabs day 1 and 2, 5 tabs day 3 and 4, 4 tabs day 5 and 6, 3 tabs day 7 and 8, 2 tabs day 9 and 10, 1 tab day 11 and 12. Take orally 03/28/15   Hassan Rowan, MD  SUPRAX 400 MG tablet Take 1 tablet (400 mg total) by mouth daily. May substitute Suprax 200 mg chewable 2 tablets a day #20 per patient's preference and cost consideration neither are price affordable to please contact our office  for other alternatives 03/17/15   Hassan RowanEugene Wade, MD   Meds Ordered and Administered this Visit  Medications - No data to display  BP 118/76 mmHg  Pulse 77  Temp(Src) 97.7 F (36.5 C) (Oral)  Resp 16  Ht 5\' 5"  (1.651 m)  Wt 180 lb (81.647 kg)  BMI 29.95 kg/m2  SpO2 99%  LMP 04/18/2015 (Exact Date) No data found.   Physical Exam   GENERAL: NAD HEENT: no pharyngeal erythema, no exudate, no erythema of TMs, no cervical LAD RESP: CTA B CARD: RRR ABD: +BS, mild epigastric and left upper quadrant tenderness, minimal left flank NEURO: CN II-XII grossly intact   ED Course  Procedures (including critical care time)  Labs Review Labs Reviewed  CBC  WITH DIFFERENTIAL/PLATELET  COMPREHENSIVE METABOLIC PANEL  AMYLASE  LIPASE, BLOOD  PREGNANCY, URINE  URINALYSIS COMPLETEWITH MICROSCOPIC ([facility] ONLY)    Imaging Review No results found.    MDM  A/P: Viral Illness- Patient's labs are not indicative of pancreatitis, likely viral illness contributing to patient's symptoms, CT was done to evaluate for nephrolithiasis. CT did not show this only possible left ovarian cyst was identified. I do recommend that patient continue hydration, rest. BRAT diet recommended. If symptoms do persist or worsen I do recommend that she seek medical attention as discussed. Patient addresses understanding of plan.   Jolene ProvostKirtida Marx Doig, MD 04/25/15 1226

## 2015-04-25 NOTE — ED Notes (Signed)
[employer] contacted ref prior arthorization, per [employer]- none needed and covered 90%.

## 2015-04-25 NOTE — Discharge Instructions (Signed)
Advance diet slowly, rest, hydration, BRAT diet, seek medical attention if symptoms persist or worsen as discussed. Follow up with GYN regarding her left ovary cyst.

## 2015-04-25 NOTE — ED Notes (Signed)
LUQ abd pain 7/10, vomiting, diarrhea x 1 week. Pt states hx of similar symptoms with gall stones.

## 2015-10-26 ENCOUNTER — Encounter: Payer: Self-pay | Admitting: Emergency Medicine

## 2015-10-26 ENCOUNTER — Ambulatory Visit
Admission: EM | Admit: 2015-10-26 | Discharge: 2015-10-26 | Disposition: A | Discharge status: Home / Self Care | Payer: Managed Care, Other (non HMO) | Attending: Family Medicine | Admitting: Family Medicine

## 2015-10-26 DIAGNOSIS — N949 Unspecified condition associated with female genital organs and menstrual cycle: Secondary | ICD-10-CM | POA: Diagnosis not present

## 2015-10-26 DIAGNOSIS — N390 Urinary tract infection, site not specified: Secondary | ICD-10-CM

## 2015-10-26 DIAGNOSIS — R102 Pelvic and perineal pain: Secondary | ICD-10-CM

## 2015-10-26 DIAGNOSIS — N898 Other specified noninflammatory disorders of vagina: Secondary | ICD-10-CM

## 2015-10-26 LAB — URINALYSIS COMPLETE WITH MICROSCOPIC (ARMC ONLY)
BILIRUBIN URINE: NEGATIVE
GLUCOSE, UA: NEGATIVE mg/dL
KETONES UR: NEGATIVE mg/dL
NITRITE: NEGATIVE
Protein, ur: 100 mg/dL — AB
Specific Gravity, Urine: 1.015 (ref 1.005–1.030)
pH: 5.5 (ref 5.0–8.0)

## 2015-10-26 LAB — WET PREP, GENITAL
Clue Cells Wet Prep HPF POC: NONE SEEN
Sperm: NONE SEEN
Trich, Wet Prep: NONE SEEN
Yeast Wet Prep HPF POC: NONE SEEN

## 2015-10-26 MED ORDER — CEFTRIAXONE SODIUM 250 MG IJ SOLR
250.0000 mg | Freq: Once | INTRAMUSCULAR | Status: AC
  Administered 2015-10-26: 250 mg via INTRAMUSCULAR

## 2015-10-26 MED ORDER — METRONIDAZOLE 500 MG PO TABS: 500.0000 mg | ORAL_TABLET | Freq: Two times a day (BID) | ORAL | 0 refills | Status: DC

## 2015-10-26 MED ORDER — PHENAZOPYRIDINE HCL 200 MG PO TABS: 200.0000 mg | ORAL_TABLET | Freq: Three times a day (TID) | ORAL | 0 refills | Status: DC

## 2015-10-26 MED ORDER — DOXYCYCLINE HYCLATE 100 MG PO CAPS: 100.0000 mg | ORAL_CAPSULE | Freq: Two times a day (BID) | ORAL | 0 refills | Status: DC

## 2015-10-26 NOTE — ED Triage Notes (Signed)
Patient c/o burning and urinary urgency that started last Tuesday.

## 2015-10-26 NOTE — ED Provider Notes (Signed)
CSN: [id number]     Arrival date & time 10/26/15  1927 History   First MD Initiated Contact with Patient 10/26/15 2020     Chief Complaint  Patient presents with  . Dysuria   (Consider location/radiation/quality/duration/timing/severity/associated sxs/prior Treatment) HPI 30 year old female who presents with a history of urinary burning and urgency. She states that she has a long history of recurrent urinary tract infections and was evaluated by a urologist. He told her that she was having incomplete emptying of her bilateral which makes her more susceptible to the infections. He states that she uses all the right techniques for prevention but despite this continues to have them. She also states that she's been having large amount of vaginal discharge recently and just finished her period about 3 days ago and was unable to tolerate a tampon due to the discomfort. Discharge has been a clear to milky non-malodorous discharge. This is more than what she normally has. She states that she has felt feverish although she is afebrile today and has had some back pain.       Past Medical History:  Diagnosis Date  . Lyme disease   . Pacemaker    Past Surgical History:  Procedure Laterality Date  . CARDIAC SURGERY    . CESAREAN SECTION    . CHOLECYSTECTOMY     History reviewed. No pertinent family history. Social History  Substance Use Topics  . Smoking status: Never Smoker  . Smokeless tobacco: Never Used  . Alcohol use No   OB History    No data available     Review of Systems  Constitutional: Positive for chills, fatigue and fever. Negative for activity change and appetite change.  Gastrointestinal: Positive for nausea.  Genitourinary: Positive for dysuria, frequency, hematuria, urgency, vaginal discharge and vaginal pain.  All other systems reviewed and are negative.   Allergies  Latex; Oxycodone; and Vicodin [hydrocodone-acetaminophen]  Home Medications   Prior to Admission  medications   Medication Sig Start Date End Date Taking? Authorizing Provider  tiZANidine (ZANAFLEX) 4 MG tablet Take 4 mg by mouth every 6 (six) hours as needed for muscle spasms.   Yes Historical Provider, MD  acetaminophen (TYLENOL) 500 MG tablet Take 1,000 mg by mouth every 6 (six) hours as needed.    Historical Provider, MD  doxycycline (VIBRAMYCIN) 100 MG capsule Take 1 capsule (100 mg total) by mouth 2 (two) times daily. 10/26/15   Lutricia Feil, PA-C  loratadine (CLARITIN) 10 MG tablet Take 1 tablet (10 mg total) by mouth daily. Take 1 tablet in the morning. As needed for itching. 03/28/15   Hassan Rowan, MD  metroNIDAZOLE (FLAGYL) 500 MG tablet Take 1 tablet (500 mg total) by mouth 2 (two) times daily. 10/26/15   Lutricia Feil, PA-C  phenazopyridine (PYRIDIUM) 200 MG tablet Take 1 tablet (200 mg total) by mouth 3 (three) times daily. 10/26/15   Lutricia Feil, PA-C  sertraline (ZOLOFT) 50 MG tablet Take 50 mg by mouth 1 day or 1 dose.    Historical Provider, MD   Meds Ordered and Administered this Visit   Medications  cefTRIAXone (ROCEPHIN) injection 250 mg (250 mg Intramuscular Given 10/26/15 2107)    BP 130/77 (BP Location: Left Arm)   Pulse 78   Temp 97.5 F (36.4 C) (Tympanic)   Resp 16   Ht 5\' 5"  (1.651 m)   Wt 167 lb (75.8 kg)   LMP 10/17/2015 (Exact Date)   SpO2 100%   BMI 27.79  kg/m  No data found.   Physical Exam  Constitutional: She appears well-developed and well-nourished. No distress.  HENT:  Head: Normocephalic and atraumatic.  Eyes: EOM are normal. Pupils are equal, round, and reactive to light.  Neck: Normal range of motion. Neck supple.  Abdominal: Soft. Bowel sounds are normal. There is tenderness.  Genitourinary: Vaginal discharge found.  Genitourinary Comments: Exam was performed with Herbert SetaHeather, RN as Nature conservation officerchaperone assistant. External Genitalia normal. Urethra is non-erythematous. Is a mild amount of discharge at the introitus which is a thin creamy color. Is  non-malodorous. Speculum exam showed thin milky discharge at the fornix and also covering the vaginal walls. Samples were taken and submitted for wet prep. Bimanual examination showed significant bilateral adnexal tenderness and mild cervical tenderness with motion.  Skin: She is not diaphoretic.  Nursing note and vitals reviewed.   Urgent Care Course   Clinical Course    Procedures (including critical care time)  Labs Review Labs Reviewed  WET PREP, GENITAL - Abnormal; Notable for the following:       Result Value   WBC, Wet Prep HPF POC MANY (*)    All other components within normal limits  URINALYSIS COMPLETEWITH MICROSCOPIC ([facility] ONLY) - Abnormal; Notable for the following:    APPearance CLOUDY (*)    Hgb urine dipstick LARGE (*)    Protein, ur 100 (*)    Leukocytes, UA SMALL (*)    Bacteria, UA FEW (*)    Squamous Epithelial / LPF 21-50 (*)    All other components within normal limits  URINE CULTURE  CHLAMYDIA/NGC RT PCR ([facility] ONLY)    Imaging Review No results found.   Visual Acuity Review  Right Eye Distance:   Left Eye Distance:   Bilateral Distance:    Right Eye Near:   Left Eye Near:    Bilateral Near:     Medications  cefTRIAXone (ROCEPHIN) injection 250 mg (250 mg Intramuscular Given 10/26/15 2107)      MDM   1. UTI (lower urinary tract infection)   2. Vaginal discharge   3. Adnexal tenderness    Discharge Medication List as of 10/26/2015  9:17 PM    START taking these medications   Details  doxycycline (VIBRAMYCIN) 100 MG capsule Take 1 capsule (100 mg total) by mouth 2 (two) times daily., Starting Fri 10/26/2015, Print    metroNIDAZOLE (FLAGYL) 500 MG tablet Take 1 tablet (500 mg total) by mouth 2 (two) times daily., Starting Fri 10/26/2015, Print      Plan: 1. Test/x-ray results and diagnosis reviewed with patient 2. rx as per orders; risks, benefits, potential side effects reviewed with patient 3. Recommend supportive treatment  with Increasing fluid intake. Refrain from sex while being treated. With primary care physician in one week. GC chlamydia cultures should be available tomorrow and urine culture in 48 hours. I explained possible PID infection to the patient for which we are treating empirically at this time. Dreamish also cover her urinary tract infection until cultures and sensitivities are available. 4. F/u prn if symptoms worsen or don't improve     Lutricia FeilWilliam P Brighton Delio, PA-C 10/26/15 2122

## 2015-10-27 LAB — CHLAMYDIA/NGC RT PCR (ARMC ONLY)
Chlamydia Tr: NOT DETECTED
N gonorrhoeae: NOT DETECTED

## 2015-10-28 LAB — URINE CULTURE

## 2015-10-29 ENCOUNTER — Telehealth: Payer: Self-pay | Admitting: *Deleted

## 2015-10-29 NOTE — Telephone Encounter (Signed)
Called patient and informed her that her gonorrhea and chlamydia results came back negative. Patient confirmed understanding of information.

## 2016-04-26 ENCOUNTER — Ambulatory Visit
Admission: EM | Admit: 2016-04-26 | Discharge: 2016-04-26 | Disposition: A | Discharge status: Home / Self Care | Payer: Commercial Managed Care - PPO | Attending: Family Medicine | Admitting: Family Medicine

## 2016-04-26 ENCOUNTER — Encounter: Payer: Self-pay | Admitting: *Deleted

## 2016-04-26 DIAGNOSIS — N3000 Acute cystitis without hematuria: Secondary | ICD-10-CM

## 2016-04-26 HISTORY — DX: Urinary tract infection, site not specified: N39.0

## 2016-04-26 LAB — URINALYSIS, COMPLETE (UACMP) WITH MICROSCOPIC
Bilirubin Urine: NEGATIVE
GLUCOSE, UA: NEGATIVE mg/dL
KETONES UR: NEGATIVE mg/dL
NITRITE: NEGATIVE
Specific Gravity, Urine: 1.025 (ref 1.005–1.030)
pH: 5.5 (ref 5.0–8.0)

## 2016-04-26 LAB — PREGNANCY, URINE: Preg Test, Ur: NEGATIVE

## 2016-04-26 MED ORDER — NITROFURANTOIN MONOHYD MACRO 100 MG PO CAPS: 100.0000 mg | ORAL_CAPSULE | Freq: Two times a day (BID) | ORAL | 0 refills | Status: DC

## 2016-04-26 NOTE — ED Provider Notes (Signed)
[facility]    CSN: [id number] Arrival date & time: 04/26/16  1023     History   Chief Complaint Chief Complaint  Patient presents with  . Urinary Frequency    HPI Melissa Mcpherson is a 31 y.o. female.   The history is provided by the patient.  Urinary Frequency  Pertinent negatives include no abdominal pain.  Dysuria  Pain quality:  Burning Pain severity:  Moderate Onset quality:  Sudden Duration:  2 days Timing:  Constant Progression:  Worsening Chronicity:  New Recent urinary tract infections: no   Relieved by:  None tried Ineffective treatments:  None tried Urinary symptoms: frequent urination   Urinary symptoms: no discolored urine, no foul-smelling urine, no hematuria, no hesitancy and no bladder incontinence   Associated symptoms: no abdominal pain, no fever, no flank pain, no genital lesions, no nausea, no vaginal discharge and no vomiting   Risk factors: no hx of pyelonephritis, no hx of urolithiasis, no kidney transplant, not pregnant, no recurrent urinary tract infections, no renal cysts, no renal disease, no sexually transmitted infections, no single kidney and no urinary catheter     Past Medical History:  Diagnosis Date  . Lyme disease   . Pacemaker   . UTI (urinary tract infection)     There are no active problems to display for this patient.   Past Surgical History:  Procedure Laterality Date  . CARDIAC SURGERY    . CESAREAN SECTION    . CHOLECYSTECTOMY      OB History    No data available       Home Medications    Prior to Admission medications   Medication Sig Start Date End Date Taking? Authorizing Provider  sertraline (ZOLOFT) 50 MG tablet Take 50 mg by mouth 1 day or 1 dose.   Yes Historical Provider, MD  tiZANidine (ZANAFLEX) 4 MG tablet Take 4 mg by mouth every 6 (six) hours as needed for muscle spasms.   Yes Historical Provider, MD  topiramate (TOPAMAX) 25 MG capsule Take 25 mg by mouth at bedtime.   Yes  Historical Provider, MD  acetaminophen (TYLENOL) 500 MG tablet Take 1,000 mg by mouth every 6 (six) hours as needed.    Historical Provider, MD  doxycycline (VIBRAMYCIN) 100 MG capsule Take 1 capsule (100 mg total) by mouth 2 (two) times daily. 10/26/15   Lutricia Feil, PA-C  loratadine (CLARITIN) 10 MG tablet Take 1 tablet (10 mg total) by mouth daily. Take 1 tablet in the morning. As needed for itching. 03/28/15   Hassan Rowan, MD  metroNIDAZOLE (FLAGYL) 500 MG tablet Take 1 tablet (500 mg total) by mouth 2 (two) times daily. 10/26/15   Lutricia Feil, PA-C  nitrofurantoin, macrocrystal-monohydrate, (MACROBID) 100 MG capsule Take 1 capsule (100 mg total) by mouth 2 (two) times daily. 04/26/16   Payton Mccallum, MD  phenazopyridine (PYRIDIUM) 200 MG tablet Take 1 tablet (200 mg total) by mouth 3 (three) times daily. 10/26/15   Lutricia Feil, PA-C    Family History History reviewed. No pertinent family history.  Social History Social History  Substance Use Topics  . Smoking status: Never Smoker  . Smokeless tobacco: Never Used  . Alcohol use No     Allergies   Carvedilol; Latex; Oxycodone; and Vicodin [hydrocodone-acetaminophen]   Review of Systems Review of Systems  Constitutional: Negative for fever.  Gastrointestinal: Negative for abdominal pain, nausea and vomiting.  Genitourinary: Positive for dysuria and frequency. Negative for flank  pain and vaginal discharge.     Physical Exam Triage Vital Signs ED Triage Vitals  Enc Vitals Group     BP 04/26/16 1118 117/68     Pulse Rate 04/26/16 1118 74     Resp 04/26/16 1118 16     Temp 04/26/16 1118 98.8 F (37.1 C)     Temp Source 04/26/16 1118 Oral     SpO2 04/26/16 1118 100 %     Weight 04/26/16 1120 165 lb (74.8 kg)     Height 04/26/16 1120 5\' 5"  (1.651 m)     Head Circumference --      Peak Flow --      Pain Score 04/26/16 1127 4     Pain Loc --      Pain Edu? --      Excl. in GC? --    No data found.   Updated  Vital Signs BP 117/68 (BP Location: Left Arm)   Pulse 74   Temp 98.8 F (37.1 C) (Oral)   Resp 16   Ht 5\' 5"  (1.651 m)   Wt 165 lb (74.8 kg)   LMP 04/12/2016   SpO2 100%   BMI 27.46 kg/m   Visual Acuity Right Eye Distance:   Left Eye Distance:   Bilateral Distance:    Right Eye Near:   Left Eye Near:    Bilateral Near:     Physical Exam  Constitutional: She appears well-developed and well-nourished. No distress.  Abdominal: Soft. Bowel sounds are normal. She exhibits no distension and no mass. There is tenderness (mild, suprapubic). There is no rebound and no guarding.  Skin: She is not diaphoretic.  Nursing note and vitals reviewed.    UC Treatments / Results  Labs (all labs ordered are listed, but only abnormal results are displayed) Labs Reviewed  URINALYSIS, COMPLETE (UACMP) WITH MICROSCOPIC - Abnormal; Notable for the following:       Result Value   APPearance CLOUDY (*)    Hgb urine dipstick SMALL (*)    Protein, ur TRACE (*)    Leukocytes, UA SMALL (*)    Squamous Epithelial / LPF 6-30 (*)    Bacteria, UA RARE (*)    All other components within normal limits  URINE CULTURE  PREGNANCY, URINE    EKG  EKG Interpretation None       Radiology No results found.  Procedures Procedures (including critical care time)  Medications Ordered in UC Medications - No data to display   Initial Impression / Assessment and Plan / UC Course  I have reviewed the triage vital signs and the nursing notes.  Pertinent labs & imaging results that were available during my care of the patient were reviewed by me and considered in my medical decision making (see chart for details).       Final Clinical Impressions(s) / UC Diagnoses   Final diagnoses:  Acute cystitis without hematuria    New Prescriptions Discharge Medication List as of 04/26/2016 12:08 PM    START taking these medications   Details  nitrofurantoin, macrocrystal-monohydrate, (MACROBID) 100  MG capsule Take 1 capsule (100 mg total) by mouth 2 (two) times daily., Starting Sat 04/26/2016, Normal       1. Lab results and diagnosis reviewed with patient 2. rx as per orders above; reviewed possible side effects, interactions, risks and benefits  3. Recommend supportive treatment with fluids 4. Follow-up prn if symptoms worsen or don't improve   Payton Mccallumrlando Loree Shehata, MD 04/26/16 1231

## 2016-04-26 NOTE — ED Triage Notes (Signed)
Patient started having symptoms of urinary frequency and burning during urination 2 days ago. Patient has a history of UTI.

## 2016-04-28 LAB — URINE CULTURE: Special Requests: NORMAL

## 2016-05-10 IMAGING — CT CT RENAL STONE PROTOCOL
2 of 4 series · 17 of 46 positions shown, 19 images · non-contrast
Comparison: 02/15/2014

CLINICAL DATA: Left upper quadrant pain vomiting and diarrhea for 1
week, left flank pain

EXAM:
CT ABDOMEN AND PELVIS WITHOUT CONTRAST
TECHNIQUE: Multidetector CT imaging of the abdomen and pelvis was performed
following the standard protocol without IV contrast.

[Series 2: soft tissue · axial · 0.71mm/px · z∈[-849,-434]mm · 14 of 91 slices shown, 16 images]
[im 4/91  soft-tissue]
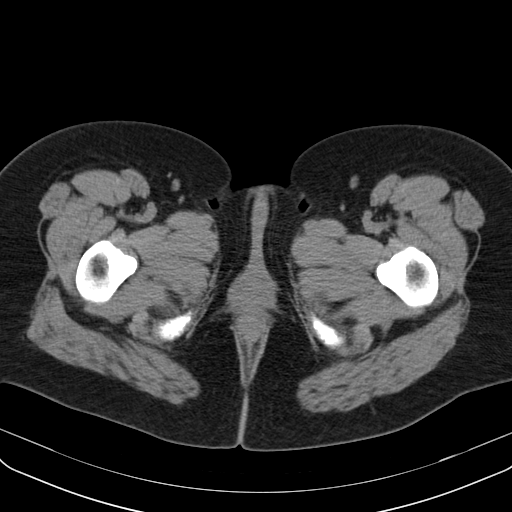
[im 4/91  bone]
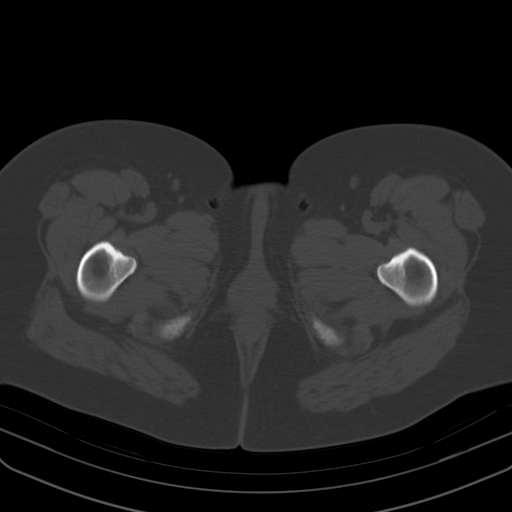
[im 11/91  soft-tissue]
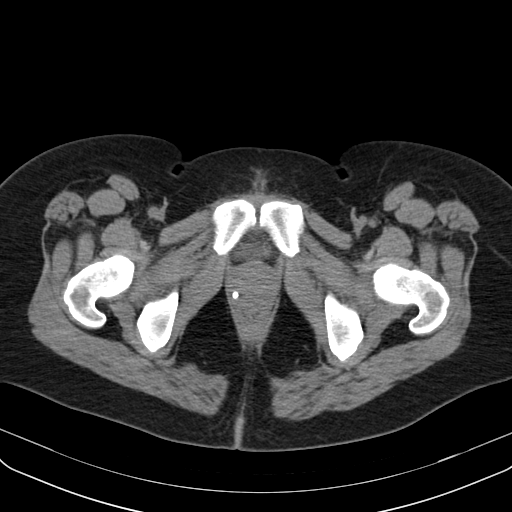
[im 19/91  soft-tissue]
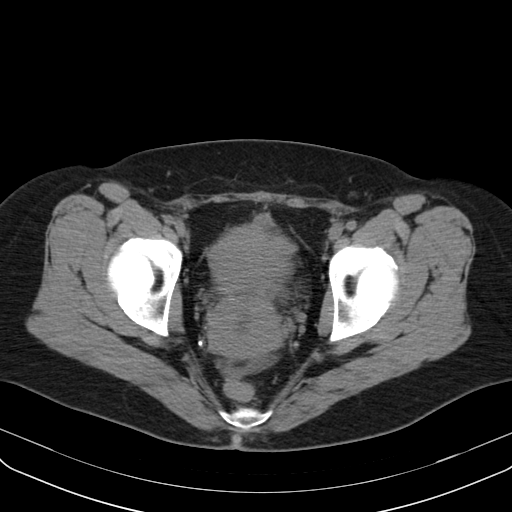
[im 26/91  soft-tissue]
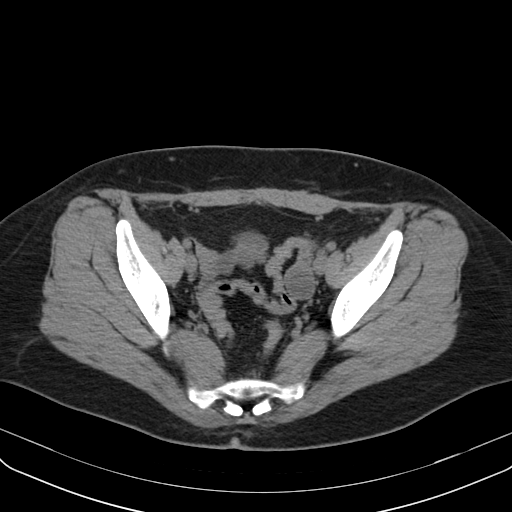
[im 29/91  soft-tissue]
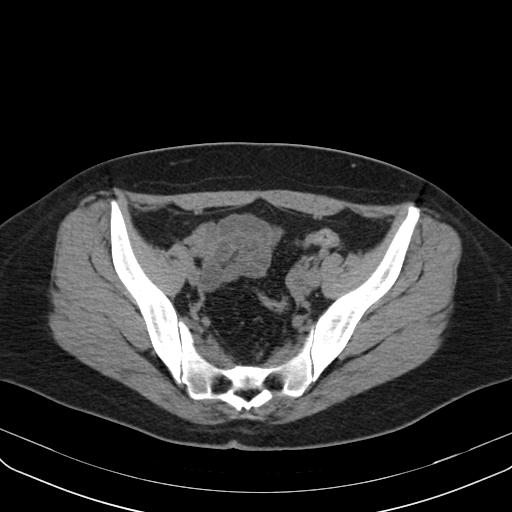
[im 37/91  soft-tissue]
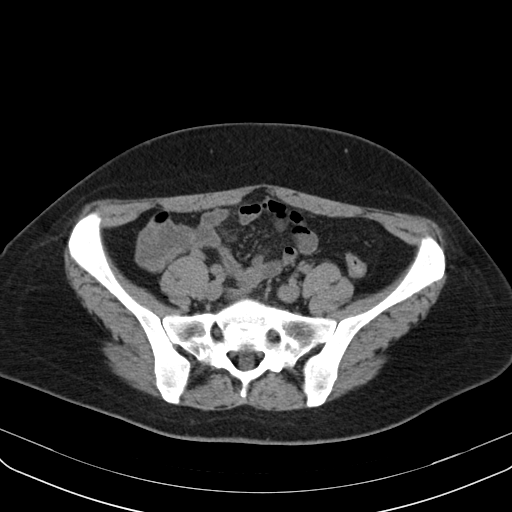
[im 44/91  soft-tissue]
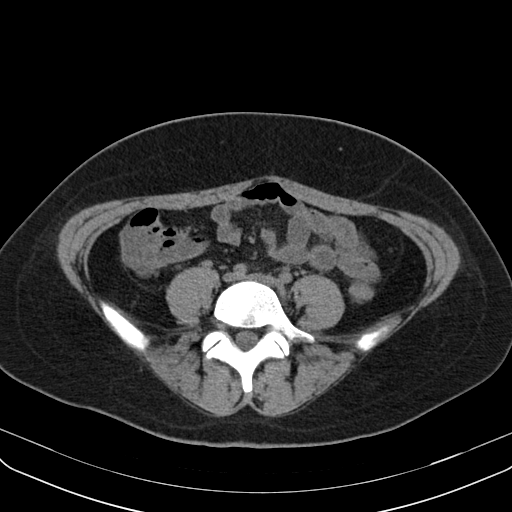
[im 47/91  soft-tissue]
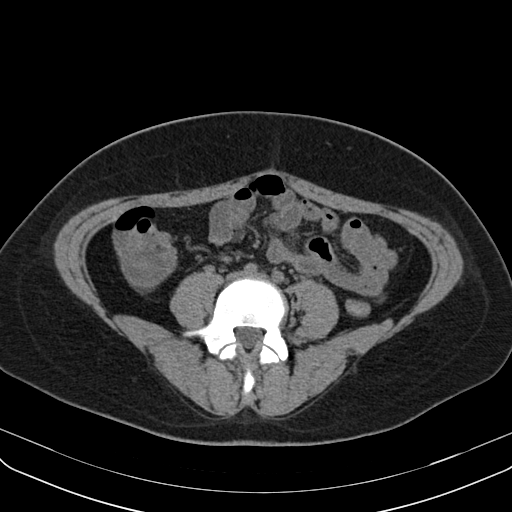
[im 55/91  soft-tissue]
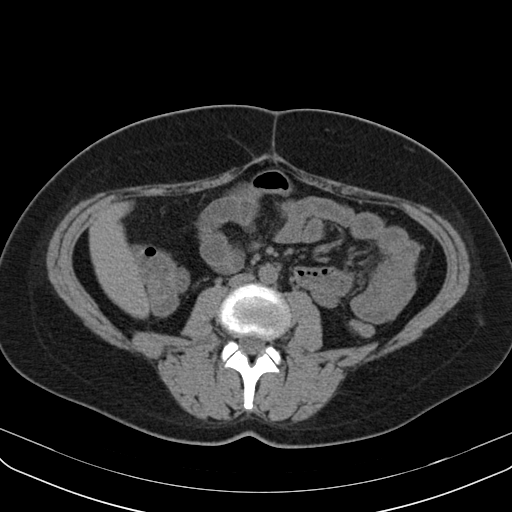
[im 55/91  bone]
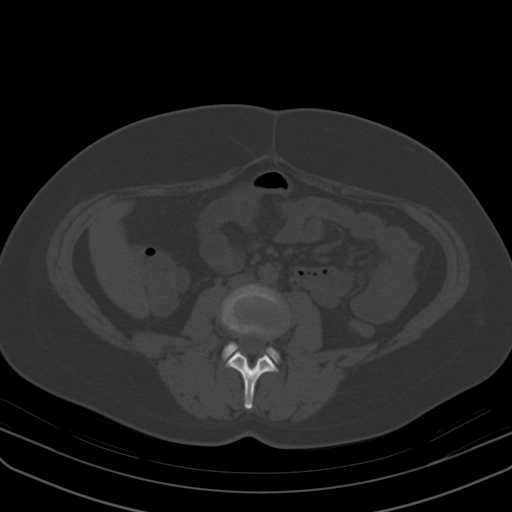
[im 62/91  soft-tissue]
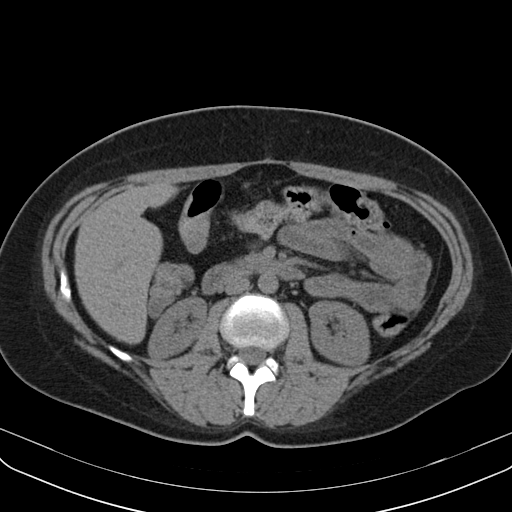
[im 69/91  soft-tissue]
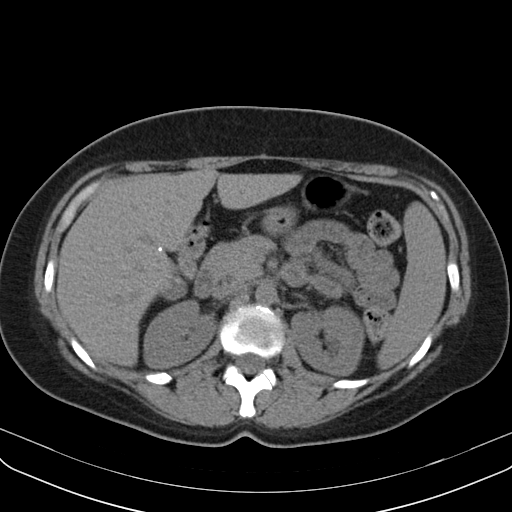
[im 73/91  soft-tissue]
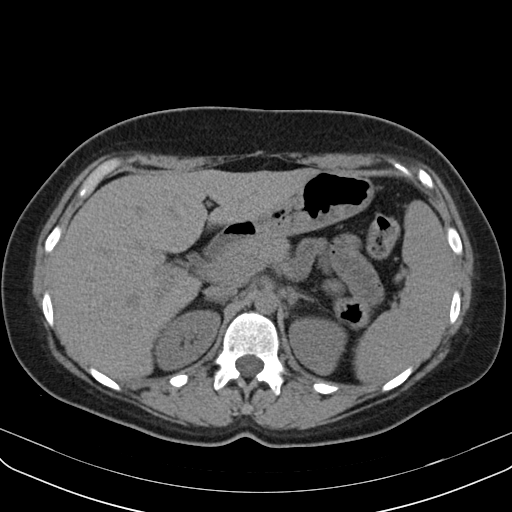
[im 80/91  soft-tissue]
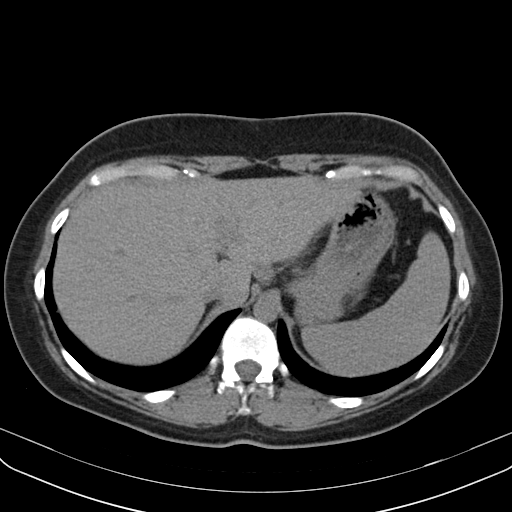
[im 87/91  soft-tissue]
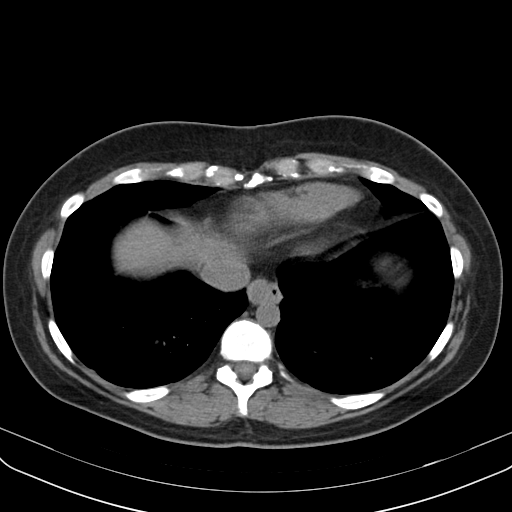

[Series 602: coronal · coronal · 0.90mm/px · 3 of 101 slices shown]
[im 34/101  soft-tissue]
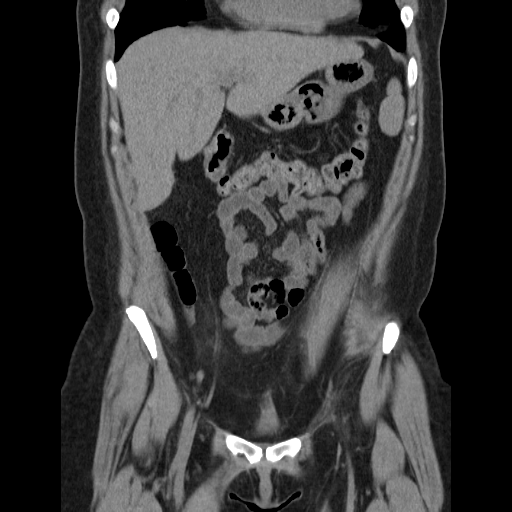
[im 45/101  soft-tissue]
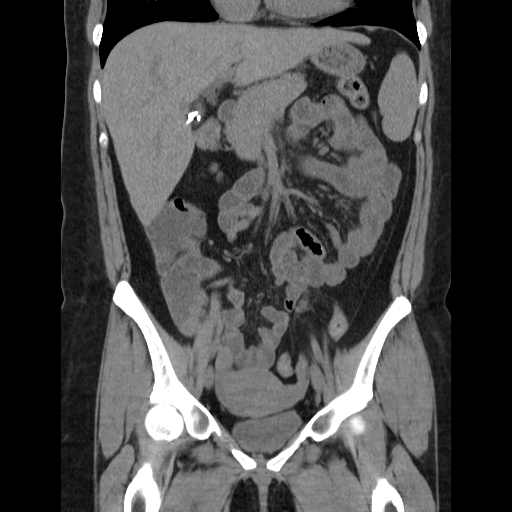
[im 56/101  soft-tissue]
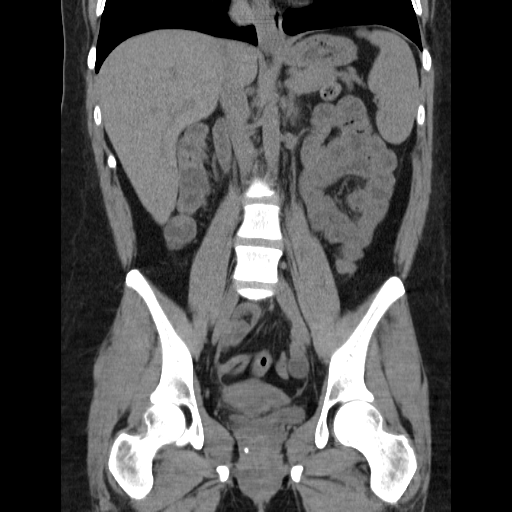

[17 of 46 positions shown; findings below may reference images not displayed]

FINDINGS: Lower chest:  The lung bases are clear

Hepatobiliary: Status post cholecystectomy.  Liver is normal.

Pancreas: Normal

Spleen: Normal

Adrenals/Urinary Tract: Adrenal glands are normal. Kidneys are
normal. No hydronephrosis. No calculi. Bladder is normal.

Stomach/Bowel: Oval except for tiny hiatal hernia

Vascular/Lymphatic: No significant adenopathy. No vascular
abnormalities.

Reproductive: Prominent left ovarian follicle or cyst measuring
cm. Otherwise negative.

Other: Physiologic volume of free fluid in the inferior pelvis

Musculoskeletal: No acute musculoskeletal findings
IMPRESSION: Physiologic volume free fluid in the pelvis, a nonspecific finding
but potentially of gynecologic origin given the benign-appearing but
mildly prominent left ovarian follicle or cyst which measures
cm.

## 2016-05-11 ENCOUNTER — Ambulatory Visit
Admission: EM | Admit: 2016-05-11 | Discharge: 2016-05-11 | Disposition: A | Discharge status: Home / Self Care | Payer: Commercial Managed Care - PPO | Attending: Family Medicine | Admitting: Family Medicine

## 2016-05-11 ENCOUNTER — Encounter: Payer: Self-pay | Admitting: *Deleted

## 2016-05-11 DIAGNOSIS — N390 Urinary tract infection, site not specified: Secondary | ICD-10-CM

## 2016-05-11 DIAGNOSIS — R319 Hematuria, unspecified: Secondary | ICD-10-CM | POA: Diagnosis not present

## 2016-05-11 LAB — URINALYSIS, COMPLETE (UACMP) WITH MICROSCOPIC
Bilirubin Urine: NEGATIVE
GLUCOSE, UA: NEGATIVE mg/dL
Ketones, ur: NEGATIVE mg/dL
Nitrite: NEGATIVE
PH: 7 (ref 5.0–8.0)
PROTEIN: NEGATIVE mg/dL
Specific Gravity, Urine: 1.015 (ref 1.005–1.030)

## 2016-05-11 MED ORDER — PHENAZOPYRIDINE HCL 200 MG PO TABS: 200.0000 mg | ORAL_TABLET | Freq: Three times a day (TID) | ORAL | 0 refills | Status: DC | PRN

## 2016-05-11 MED ORDER — FOSFOMYCIN TROMETHAMINE 3 G PO PACK: 3.0000 g | PACK | Freq: Once | ORAL | 0 refills | Status: AC

## 2016-05-11 MED ORDER — FLUCONAZOLE 150 MG PO TABS: 150.0000 mg | ORAL_TABLET | Freq: Once | ORAL | 0 refills | Status: AC

## 2016-05-11 NOTE — ED Triage Notes (Signed)
Pt seen here 2 weeks ago for UTI symptoms. Returns today with same symptoms with little or no improvement. Now c/o dysuria, urinary freq/urg, and chills.

## 2016-05-11 NOTE — ED Provider Notes (Signed)
[facility]    CSN: [id number] Arrival date & time: 05/11/16  0825     History   Chief Complaint Chief Complaint  Patient presents with  . Dysuria  . Urinary Frequency    HPI Melissa Mcpherson is a 31 y.o. female.   Patient is a 31 year old white female with a history of recurrent UTIs. She was seen here 2 weeks ago placed on Septra at that time. Urine culture was obtained and grew out multiple species. Stop patient need to have another possible urine specimen obtained. It also turns out the patient cessation of differential acute completely recovered from the previous treatment. She felt like there was irritation of the bladder and lower pelvic pain but since was that bad until this morning when he just really started all over again. As she states felt like it was saying I'm here again back. She's had extensive urological evaluation doesn't think since she supposed to do imaging of bladder before sex etc. she seemed urologist as been evaluated as well. She denies this time any vaginal discharge she's had before dyspareunia or vaginal irritation. She takes Azo over the last few days but hasn't helped much she reports the Pyridium does seem to work better when she gets that prescription strength past HISTORY she's had a pacemaker put in she's had a cholecystectomy C-section for childbirth she's never smoked. Allergies include oxycodone, Covera latex and Vicodin.      The history is provided by the patient.  Dysuria  Pain quality:  Aching and burning Pain severity:  Moderate Onset quality:  Unable to specify Timing:  Constant Progression:  Worsening Chronicity:  Recurrent Recent urinary tract infections: yes   Relieved by:  Nothing Worsened by:  Nothing Ineffective treatments:  Phenazopyridine Urinary symptoms: frequent urination   Urinary symptoms: no foul-smelling urine and no hematuria   Associated symptoms: no abdominal pain and no vaginal discharge   Risk  factors: recurrent urinary tract infections   Urinary Frequency  Pertinent negatives include no abdominal pain.    Past Medical History:  Diagnosis Date  . Lyme disease   . Pacemaker   . UTI (urinary tract infection)     There are no active problems to display for this patient.   Past Surgical History:  Procedure Laterality Date  . CARDIAC SURGERY    . CESAREAN SECTION    . CHOLECYSTECTOMY      OB History    No data available       Home Medications    Prior to Admission medications   Medication Sig Start Date End Date Taking? Authorizing Provider  sertraline (ZOLOFT) 50 MG tablet Take 50 mg by mouth 1 day or 1 dose.   Yes Historical Provider, MD  tiZANidine (ZANAFLEX) 4 MG tablet Take 4 mg by mouth every 6 (six) hours as needed for muscle spasms.   Yes Historical Provider, MD  topiramate (TOPAMAX) 25 MG capsule Take 25 mg by mouth at bedtime.   Yes Historical Provider, MD  acetaminophen (TYLENOL) 500 MG tablet Take 1,000 mg by mouth every 6 (six) hours as needed.    Historical Provider, MD  fluconazole (DIFLUCAN) 150 MG tablet Take 1 tablet (150 mg total) by mouth once. 05/11/16 05/11/16  Hassan RowanEugene Mahek Schlesinger, MD  fosfomycin (MONUROL) 3 g PACK Take 3 g by mouth once. If insurance does not cover please replace with Macrobid 100 mg #20 one capsule twice a day for 10 days 05/11/16 05/11/16  Hassan RowanEugene Daleyza Gadomski, MD  nitrofurantoin,  macrocrystal-monohydrate, (MACROBID) 100 MG capsule Take 1 capsule (100 mg total) by mouth 2 (two) times daily. 04/26/16   Payton Mccallum, MD  phenazopyridine (PYRIDIUM) 200 MG tablet Take 1 tablet (200 mg total) by mouth 3 (three) times daily as needed for pain. 05/11/16   Hassan Rowan, MD    Family History History reviewed. No pertinent family history.  Social History Social History  Substance Use Topics  . Smoking status: Never Smoker  . Smokeless tobacco: Never Used  . Alcohol use No     Allergies   Carvedilol; Latex; Oxycodone; and Vicodin  [hydrocodone-acetaminophen]   Review of Systems Review of Systems  Gastrointestinal: Negative for abdominal pain.  Genitourinary: Positive for dysuria and urgency. Negative for dyspareunia, frequency, hematuria, menstrual problem, pelvic pain, vaginal discharge and vaginal pain.  All other systems reviewed and are negative.    Physical Exam Triage Vital Signs ED Triage Vitals  Enc Vitals Group     BP 05/11/16 0836 121/71     Pulse Rate 05/11/16 0836 73     Resp 05/11/16 0836 16     Temp 05/11/16 0836 98 F (36.7 C)     Temp Source 05/11/16 0836 Oral     SpO2 05/11/16 0836 100 %     Weight 05/11/16 0838 165 lb (74.8 kg)     Height 05/11/16 0838 5\' 5"  (1.651 m)     Head Circumference --      Peak Flow --      Pain Score --      Pain Loc --      Pain Edu? --      Excl. in GC? --    No data found.   Updated Vital Signs BP 121/71 (BP Location: Left Arm)   Pulse 73   Temp 98 F (36.7 C) (Oral)   Resp 16   Ht 5\' 5"  (1.651 m)   Wt 165 lb (74.8 kg)   LMP 05/05/2016 (Approximate)   SpO2 100%   BMI 27.46 kg/m   Visual Acuity Right Eye Distance:   Left Eye Distance:   Bilateral Distance:    Right Eye Near:   Left Eye Near:    Bilateral Near:     Physical Exam  Constitutional: She is oriented to person, place, and time. She appears well-developed and well-nourished.  HENT:  Head: Normocephalic and atraumatic.  Right Ear: External ear normal.  Left Ear: External ear normal.  Neck: Normal range of motion.  Pulmonary/Chest: Effort normal.  Abdominal: Soft. Bowel sounds are normal. She exhibits no distension.  Musculoskeletal: Normal range of motion.  Neurological: She is alert and oriented to person, place, and time. No cranial nerve deficit.  Skin: Skin is warm.  Psychiatric: She has a normal mood and affect.  Vitals reviewed.    UC Treatments / Results  Labs (all labs ordered are listed, but only abnormal results are displayed) Labs Reviewed  URINALYSIS,  COMPLETE (UACMP) WITH MICROSCOPIC - Abnormal; Notable for the following:       Result Value   APPearance CLOUDY (*)    Hgb urine dipstick MODERATE (*)    Leukocytes, UA LARGE (*)    Squamous Epithelial / LPF 0-5 (*)    Bacteria, UA RARE (*)    All other components within normal limits  URINE CULTURE    EKG  EKG Interpretation None       Radiology No results found.  Procedures Procedures (including critical care time)  Medications Ordered in UC Medications -  No data to display   Initial Impression / Assessment and Plan / UC Course  I have reviewed the triage vital signs and the nursing notes.  Pertinent labs & imaging results that were available during my care of the patient were reviewed by me and considered in my medical decision making (see chart for details).     Urinalysis this time much better specimen with only 0-5 at the cells will try to reculture urine. Since she's had multiple failures with typical antibiotics we'll try him on UL if her insurance will cover it it not was switched to Macrobid 100 mg twice a day for 10 days also restarted on Pyridium and gave her Diflucan if needed follow-up with PCP in 1-2 weeks so stated.  Final Clinical Impressions(s) / UC Diagnoses   Final diagnoses:  Urinary tract infection with hematuria, site unspecified    New Prescriptions Discharge Medication List as of 05/11/2016  9:18 AM    START taking these medications   Details  fluconazole (DIFLUCAN) 150 MG tablet Take 1 tablet (150 mg total) by mouth once., Starting Sun 05/11/2016, Normal    fosfomycin (MONUROL) 3 g PACK Take 3 g by mouth once. If insurance does not cover please replace with Macrobid 100 mg #20 one capsule twice a day for 10 days, Starting Sun 05/11/2016, Normal    phenazopyridine (PYRIDIUM) 200 MG tablet Take 1 tablet (200 mg total) by mouth 3 (three) times daily as needed for pain., Starting Sun 05/11/2016, Normal         Results for orders placed or  performed during the hospital encounter of 05/11/16  Urinalysis, Complete w Microscopic  Result Value Ref Range   Color, Urine YELLOW YELLOW   APPearance CLOUDY (A) CLEAR   Specific Gravity, Urine 1.015 1.005 - 1.030   pH 7.0 5.0 - 8.0   Glucose, UA NEGATIVE NEGATIVE mg/dL   Hgb urine dipstick MODERATE (A) NEGATIVE   Bilirubin Urine NEGATIVE NEGATIVE   Ketones, ur NEGATIVE NEGATIVE mg/dL   Protein, ur NEGATIVE NEGATIVE mg/dL   Nitrite NEGATIVE NEGATIVE   Leukocytes, UA LARGE (A) NEGATIVE   Squamous Epithelial / LPF 0-5 (A) NONE SEEN   WBC, UA TOO NUMEROUS TO COUNT 0 - 5 WBC/hpf   RBC / HPF 6-30 0 - 5 RBC/hpf   Bacteria, UA RARE (A) NONE SEEN  -    Note: This dictation was prepared with Dragon dictation along with smaller phrase technology. Any transcriptional errors that result from this process are unintentional.   Hassan Rowan, MD 05/11/16 [id number]

## 2016-05-13 LAB — URINE CULTURE: Culture: <10000 — AB

## 2016-05-14 ENCOUNTER — Telehealth: Payer: Self-pay | Admitting: *Deleted

## 2016-05-14 NOTE — Telephone Encounter (Signed)
Called patient, verified DOB, communicated inconclusive urine culture result. Advised patient to follow up with PCP or [facility] if symptoms persist.

## 2016-08-12 ENCOUNTER — Encounter: Payer: Self-pay | Admitting: *Deleted

## 2016-08-12 ENCOUNTER — Ambulatory Visit
Admission: EM | Admit: 2016-08-12 | Discharge: 2016-08-12 | Disposition: A | Discharge status: Home / Self Care | Payer: Commercial Managed Care - PPO | Attending: Emergency Medicine | Admitting: Emergency Medicine

## 2016-08-12 DIAGNOSIS — N390 Urinary tract infection, site not specified: Secondary | ICD-10-CM | POA: Diagnosis not present

## 2016-08-12 LAB — URINALYSIS, COMPLETE (UACMP) WITH MICROSCOPIC: BILIRUBIN URINE: NEGATIVE

## 2016-08-12 LAB — PREGNANCY, URINE: Preg Test, Ur: NEGATIVE

## 2016-08-12 MED ORDER — PHENAZOPYRIDINE HCL 100 MG PO TABS: 200.0000 mg | ORAL_TABLET | Freq: Three times a day (TID) | ORAL | 0 refills | Status: DC | PRN

## 2016-08-12 MED ORDER — CEPHALEXIN 500 MG PO CAPS: 500.0000 mg | ORAL_CAPSULE | Freq: Two times a day (BID) | ORAL | 0 refills | Status: AC

## 2016-08-12 NOTE — ED Triage Notes (Signed)
Dysuria, urinary freq/urg, suprapubic abd pain, nausea since yesterday.

## 2016-08-12 NOTE — ED Provider Notes (Signed)
[facility] ____________________________________________  Time seen: Approximately 8:37 AM  I have reviewed the triage vital signs and the nursing notes.   HISTORY  Chief Complaint Dysuria; Urinary Frequency; Abdominal Pain; and Nausea   HPI Melissa Mcpherson HeadingsLeigh Mcpherson is a 31 y.o. female presenting for evaluation of the urinary frequency, urinary urgency and burning with urination is in present since yesterday. Patient reports she did take 1 over-the-counter Azo yesterday which slightly helped with burning sensation but did not resolve symptoms. Patient denies vaginal discharge, vaginal odor or vaginal pain or complaints. Denies concerns of STDs. Patient reports mild suprapubic pressure sensation. Denies back pain, nausea, vomiting, fevers, diarrhea or bowel issues. Reports continues to eat and drink well. Denies known trigger, but reports sexual intercourse can be a partial trigger. Reports has had urinary tract infections in the past with similar presentation. Patient reports last UTI was approximately March of this urine to with Macrobid. States no pain at this time. Denies other complaints.  Denies chest pain, shortness of breath, abdominal pain, extremity pain, extremity swelling or rash. Denies recent sickness. Denies other recent antibiotic use.   Erskine EmeryGoodnight, William H, MD: PCP Patient's last menstrual period was 07/29/2016 (approximate).Denies pregnancy.    Past Medical History:  Diagnosis Date  . Lyme disease   . Pacemaker   . UTI (urinary tract infection)     There are no active problems to display for this patient.   Past Surgical History:  Procedure Laterality Date  . CARDIAC SURGERY    . CESAREAN SECTION    . CHOLECYSTECTOMY       No current facility-administered medications for this encounter.   Current Outpatient Prescriptions:  .  phenazopyridine (PYRIDIUM) 200 MG tablet, Take 1 tablet (200 mg total) by mouth 3 (three) times daily as needed for  pain., Disp: 15 tablet, Rfl: 0 .  tiZANidine (ZANAFLEX) 4 MG tablet, Take 4 mg by mouth every 6 (six) hours as needed for muscle spasms., Disp: , Rfl:  .  acetaminophen (TYLENOL) 500 MG tablet, Take 1,000 mg by mouth every 6 (six) hours as needed., Disp: , Rfl:  .  cephALEXin (KEFLEX) 500 MG capsule, Take 1 capsule (500 mg total) by mouth 2 (two) times daily., Disp: 14 capsule, Rfl: 0 .  nitrofurantoin, macrocrystal-monohydrate, (MACROBID) 100 MG capsule, Take 1 capsule (100 mg total) by mouth 2 (two) times daily., Disp: 14 capsule, Rfl: 0 .  phenazopyridine (PYRIDIUM) 100 MG tablet, Take 2 tablets (200 mg total) by mouth 3 (three) times daily as needed for pain., Disp: 10 tablet, Rfl: 0 .  sertraline (ZOLOFT) 50 MG tablet, Take 50 mg by mouth 1 day or 1 dose., Disp: , Rfl:  .  topiramate (TOPAMAX) 25 MG capsule, Take 25 mg by mouth at bedtime., Disp: , Rfl:   Allergies Carvedilol; Latex; Oxycodone; and Vicodin [hydrocodone-acetaminophen]  History reviewed. No pertinent family history.  Social History Social History  Substance Use Topics  . Smoking status: Never Smoker  . Smokeless tobacco: Never Used  . Alcohol use No    Review of Systems Constitutional: No fever/chills Cardiovascular: Denies chest pain. Respiratory: Denies shortness of breath. Gastrointestinal: No abdominal pain.  No nausea, no vomiting.  No diarrhea.  No constipation. Genitourinary: Positive for dysuria. Musculoskeletal: Negative for back pain. Skin: Negative for rash.   ____________________________________________   PHYSICAL EXAM:  VITAL SIGNS: ED Triage Vitals  Enc Vitals Group     BP 08/12/16 0820 121/76     Pulse Rate 08/12/16 0820 66  Resp 08/12/16 0820 16     Temp 08/12/16 0820 98.2 F (36.8 C)     Temp Source 08/12/16 0820 Oral     SpO2 08/12/16 0820 98 %     Weight 08/12/16 0822 170 lb (77.1 kg)     Height 08/12/16 0822 5\' 5"  (1.651 m)     Head Circumference --      Peak Flow --       Pain Score --      Pain Loc --      Pain Edu? --      Excl. in GC? --     Constitutional: Alert and oriented. Well appearing and in no acute distress. Cardiovascular: Normal rate, regular rhythm. Grossly normal heart sounds.  Good peripheral circulation. Respiratory: Normal respiratory effort without tachypnea nor retractions. Breath sounds are clear and equal bilaterally. No wheezes, rales, rhonchi. Gastrointestinal: Mild suprapubic tenderness. Abdomen otherwise soft and nontender. No CVA tenderness. Musculoskeletal:   No midline cervical, thoracic or lumbar tenderness to palpation.  Neurologic:  Normal speech and language. Speech is normal. No gait instability.  Skin:  Skin is warm, dry. Psychiatric: Mood and affect are normal. Speech and behavior are normal. Patient exhibits appropriate insight and judgment   ___________________________________________   LABS (all labs ordered are listed, but only abnormal results are displayed)  Labs Reviewed  URINALYSIS, COMPLETE (UACMP) WITH MICROSCOPIC - Abnormal; Notable for the following:       Result Value   Color, Urine ORANGE (*)    APPearance CLOUDY (*)    Glucose, UA   (*)    Value: TEST NOT REPORTED DUE TO COLOR INTERFERENCE OF URINE PIGMENT   Hgb urine dipstick   (*)    Value: TEST NOT REPORTED DUE TO COLOR INTERFERENCE OF URINE PIGMENT   Ketones, ur   (*)    Value: TEST NOT REPORTED DUE TO COLOR INTERFERENCE OF URINE PIGMENT   Protein, ur   (*)    Value: TEST NOT REPORTED DUE TO COLOR INTERFERENCE OF URINE PIGMENT   Nitrite   (*)    Value: TEST NOT REPORTED DUE TO COLOR INTERFERENCE OF URINE PIGMENT   Leukocytes, UA   (*)    Value: TEST NOT REPORTED DUE TO COLOR INTERFERENCE OF URINE PIGMENT   Squamous Epithelial / LPF 6-30 (*)    Bacteria, UA MANY (*)    All other components within normal limits  URINE CULTURE  PREGNANCY, URINE   ____________________________________________   PROCEDURES Procedures    INITIAL  IMPRESSION / ASSESSMENT AND PLAN / ED COURSE  Pertinent labs & imaging results that were available during my care of the patient were reviewed by me and considered in my medical decision making (see chart for details).   Well-appearing patient. No acute distress. Urinalysis reviewed. Suspect UTI. Will treat patient with oral cephalexin and when necessary Pyridium. Will culture urine. Encourage rest, fluids supportive care and avoidance of triggers as able.Discussed indication, risks and benefits of medications with patient.  Discussed follow up with Primary care physician this week. Discussed follow up and return parameters including no resolution or any worsening concerns. Patient verbalized understanding and agreed to plan.   ____________________________________________   FINAL CLINICAL IMPRESSION(S) / ED DIAGNOSES  Final diagnoses:  Urinary tract infection without hematuria, site unspecified     Discharge Medication List as of 08/12/2016  8:51 AM    START taking these medications   Details  cephALEXin (KEFLEX) 500 MG capsule Take 1 capsule (500 mg  total) by mouth 2 (two) times daily., Starting Tue 08/12/2016, Until Tue 08/19/2016, Normal    !! phenazopyridine (PYRIDIUM) 100 MG tablet Take 2 tablets (200 mg total) by mouth 3 (three) times daily as needed for pain., Starting Tue 08/12/2016, Normal     !! - Potential duplicate medications found. Please discuss with provider.      Note: This dictation was prepared with Dragon dictation along with smaller phrase technology. Any transcriptional errors that result from this process are unintentional.          Renford Dills, NP 08/12/16 1156

## 2016-08-12 NOTE — Discharge Instructions (Signed)
Take medication as prescribed. Rest. Drink plenty of fluids.  ° °Follow up with your primary care physician this week as needed. Return to Urgent care for new or worsening concerns.  ° °

## 2016-08-14 ENCOUNTER — Telehealth (HOSPITAL_COMMUNITY): Payer: Self-pay | Admitting: Internal Medicine

## 2016-08-14 LAB — URINE CULTURE: Culture: >=100000 — AB

## 2016-08-14 NOTE — Telephone Encounter (Signed)
Note opened in error.  LM 

## 2017-04-15 ENCOUNTER — Ambulatory Visit
Admission: EM | Admit: 2017-04-15 | Discharge: 2017-04-15 | Disposition: A | Discharge status: Home / Self Care | Payer: Commercial Managed Care - PPO | Attending: Family Medicine | Admitting: Family Medicine

## 2017-04-15 ENCOUNTER — Other Ambulatory Visit: Payer: Self-pay

## 2017-04-15 DIAGNOSIS — T753XXA Motion sickness, initial encounter: Secondary | ICD-10-CM

## 2017-04-15 DIAGNOSIS — Z760 Encounter for issue of repeat prescription: Secondary | ICD-10-CM | POA: Diagnosis not present

## 2017-04-15 HISTORY — DX: Irritable bowel syndrome, unspecified: K58.9

## 2017-04-15 MED ORDER — SCOPOLAMINE 1 MG/3DAYS TD PT72: 1.0000 | MEDICATED_PATCH | TRANSDERMAL | 12 refills | Status: DC

## 2017-04-15 NOTE — Discharge Instructions (Signed)
-  scopolamine: start with one to two patches, changed every 72 hours. Can increase to every 48 hours if needed.

## 2017-04-15 NOTE — ED Triage Notes (Signed)
Pt getting ready to leave on a cruise on Saturday and would like motion sickness patches to take with her.

## 2017-04-15 NOTE — ED Provider Notes (Signed)
[facility]    CSN: [id number] Arrival date & time: 04/15/17  1703     History   Chief Complaint Chief Complaint  Patient presents with  . Medication Refill    HPI Melissa Mcpherson is a 32 y.o. female.   Patient is a 32 year old female who presents with history of motion sickness and vertigo who states she is leaving to go on a cruise on Sunday.  She states her initial issues with vertigo and motion sickness started when she was pregnant.  At that time her doctor was giving her scopolamine patches that were changed every other day.  She has not had any issues with these medications in the past.  She is here requesting a prescription for scopolamine patches for her trip.      Past Medical History:  Diagnosis Date  . IBS (irritable bowel syndrome)   . Lyme disease   . Pacemaker   . UTI (urinary tract infection)     There are no active problems to display for this patient.   Past Surgical History:  Procedure Laterality Date  . CARDIAC SURGERY    . CESAREAN SECTION    . CHOLECYSTECTOMY      OB History    No data available       Home Medications    Prior to Admission medications   Medication Sig Start Date End Date Taking? Authorizing Provider  DULoxetine (CYMBALTA) 60 MG capsule Take 60 mg by mouth daily.   Yes [provider]  scopolamine (TRANSDERM-SCOP, 1.5 MG,) 1 MG/3DAYS Place 1 patch (1.5 mg total) onto the skin every 3 (three) days. 04/15/17   Candis Schatz, PA-C  tiZANidine (ZANAFLEX) 4 MG tablet Take 4 mg by mouth every 6 (six) hours as needed for muscle spasms.    [provider]    Family History History reviewed. No pertinent family history.  Social History Social History   Tobacco Use  . Smoking status: Never Smoker  . Smokeless tobacco: Never Used  Substance Use Topics  . Alcohol use: No  . Drug use: No     Allergies   Carvedilol; Latex; Oxycodone; and Vicodin  [hydrocodone-acetaminophen]   Review of Systems Review of Systems  As noted above in HPI.  Other systems reviewed and found to be negative.   Physical Exam Triage Vital Signs ED Triage Vitals [04/15/17 1718]  Enc Vitals Group     BP 132/80     Pulse Rate (!) 59     Resp 18     Temp 98.2 F (36.8 C)     Temp Source Oral     SpO2 98 %     Weight 178 lb (80.7 kg)     Height 5\' 5"  (1.651 m)     Head Circumference      Peak Flow      Pain Score      Pain Loc      Pain Edu?      Excl. in GC?    No data found.  Updated Vital Signs BP 132/80 (BP Location: Right Arm)   Pulse (!) 59   Temp 98.2 F (36.8 C) (Oral)   Resp 18   Ht 5\' 5"  (1.651 m)   Wt 178 lb (80.7 kg)   LMP 04/07/2017   SpO2 98%   BMI 29.62 kg/m   Visual Acuity Right Eye Distance:   Left Eye Distance:   Bilateral Distance:    Right Eye Near:  Left Eye Near:    Bilateral Near:     Physical Exam  Constitutional: She is oriented to person, place, and time. She appears well-developed and well-nourished. No distress.  Eyes: EOM are normal. Pupils are equal, round, and reactive to light.  Neck: Normal range of motion.  Cardiovascular: Normal rate, regular rhythm and normal heart sounds.  No murmur heard. Pulmonary/Chest: Effort normal and breath sounds normal. No stridor. No respiratory distress.  Musculoskeletal: Normal range of motion.  Neurological: She is alert and oriented to person, place, and time.  Psychiatric: She has a normal mood and affect. Her behavior is normal.     UC Treatments / Results  Labs (all labs ordered are listed, but only abnormal results are displayed) Labs Reviewed - No data to display  EKG  EKG Interpretation None       Radiology No results found.  Procedures Procedures (including critical care time)  Medications Ordered in UC Medications - No data to display   Initial Impression / Assessment and Plan / UC Course  I have reviewed the triage vital signs  and the nursing notes.  Pertinent labs & imaging results that were available during my care of the patient were reviewed by me and considered in my medical decision making (see chart for details).     Patient with history of motion sickness and vertigo in the past.  With her pregnancy she was told to use 2 patches, one behind each ear and change out every 48 hours.  We will give her a prescription for 15 patches, she can start with 1-2 patches changing every 72 hours and then can increase to every 48 if needed.  Final Clinical Impressions(s) / UC Diagnoses   Final diagnoses:  Motion sickness, initial encounter    ED Discharge Orders        Ordered    scopolamine (TRANSDERM-SCOP, 1.5 MG,) 1 MG/3DAYS  every 72 hours,   Status:  Discontinued     04/15/17 1818    scopolamine (TRANSDERM-SCOP, 1.5 MG,) 1 MG/3DAYS  every 72 hours     04/15/17 1819       Controlled Substance Prescriptions [place] Controlled Substance Registry consulted? Not Applicable   Candis SchatzHarris, Michael D, PA-C 04/15/17 1821

## 2017-09-17 ENCOUNTER — Ambulatory Visit
Admission: EM | Admit: 2017-09-17 | Discharge: 2017-09-17 | Disposition: A | Discharge status: Home / Self Care | Payer: Commercial Managed Care - PPO | Attending: Emergency Medicine | Admitting: Emergency Medicine

## 2017-09-17 ENCOUNTER — Other Ambulatory Visit: Payer: Self-pay

## 2017-09-17 ENCOUNTER — Encounter: Payer: Self-pay | Admitting: Emergency Medicine

## 2017-09-17 DIAGNOSIS — R350 Frequency of micturition: Secondary | ICD-10-CM | POA: Diagnosis not present

## 2017-09-17 DIAGNOSIS — N3001 Acute cystitis with hematuria: Secondary | ICD-10-CM

## 2017-09-17 DIAGNOSIS — R300 Dysuria: Secondary | ICD-10-CM

## 2017-09-17 DIAGNOSIS — R110 Nausea: Secondary | ICD-10-CM | POA: Diagnosis not present

## 2017-09-17 LAB — URINALYSIS, COMPLETE (UACMP) WITH MICROSCOPIC
Bilirubin Urine: NEGATIVE
GLUCOSE, UA: NEGATIVE mg/dL
Nitrite: NEGATIVE
PROTEIN: 30 mg/dL — AB
Specific Gravity, Urine: 1.025 (ref 1.005–1.030)
pH: 6 (ref 5.0–8.0)

## 2017-09-17 MED ORDER — CEPHALEXIN 500 MG PO CAPS: 500.0000 mg | ORAL_CAPSULE | Freq: Two times a day (BID) | ORAL | 0 refills | Status: DC

## 2017-09-17 MED ORDER — NITROFURANTOIN MONOHYD MACRO 100 MG PO CAPS
100.0000 mg | ORAL_CAPSULE | Freq: Two times a day (BID) | ORAL | 0 refills | Status: DC
Start: 2017-09-17 — End: 2017-09-17

## 2017-09-17 MED ORDER — PHENAZOPYRIDINE HCL 200 MG PO TABS: 200.0000 mg | ORAL_TABLET | Freq: Three times a day (TID) | ORAL | 0 refills | Status: DC | PRN

## 2017-09-17 NOTE — Discharge Instructions (Addendum)
Continue pushing fluids.  The Pyridium will help with your symptoms.  Will turn your urine orange.  We are sending your urine off for culture to make sure that we have you on the right antibiotic.  Make sure you gave us a working phone number so we can contact you if we need to change things.

## 2017-09-17 NOTE — ED Triage Notes (Signed)
Patient c/o burning when urinating and increase in urinary frequency that started today.

## 2017-09-17 NOTE — ED Provider Notes (Signed)
HPI  SUBJECTIVE:  Melissa Mcpherson is a 32 y.o. female who presents with dysuria, urinary urgency, frequency, cloudy urine, nausea starting today.  Denies odorous urine, hematuria.  No vomiting, fevers, abdominal, new back, pelvic pain.  No vaginal odor, rash, discharge, itching, abnormal vaginal bleeding.  She has been married for 15 years to her husband who is asymptomatic.  STDs are not a concern today.  No antibiotics in the past month.  No antipyretic in the past 4 to 6 hours.  No perfumed soaps or body washes.  She tried pushing fluids with some improvement in her symptoms.  No aggravating factors.  She has a past medical history of frequent UTIs, pyelonephritis.  States she frequently gets UTIs after having intercourse even though she voids and bathes immediately after.  No history of nephrolithiasis.  No history of diabetes, gonorrhea, chlamydia, HIV, HSV, syphilis, Trichomonas, BV, yeast.  LMP: Last week.  She is status post bilateral tubal ligation denies the possibility of being pregnant.  AVW:[id number]:Martini, Merlene LaughterJennifer Marie, MD  Urine culture from 2018 + for Proteus UTI that was resistant to Macrobid but susceptible to Keflex.  Past Medical History:  Diagnosis Date  . IBS (irritable bowel syndrome)   . Lyme disease   . Pacemaker   . UTI (urinary tract infection)     Past Surgical History:  Procedure Laterality Date  . CARDIAC SURGERY    . CESAREAN SECTION    . CHOLECYSTECTOMY      History reviewed. No pertinent family history.  Social History   Tobacco Use  . Smoking status: Never Smoker  . Smokeless tobacco: Never Used  Substance Use Topics  . Alcohol use: No  . Drug use: No    No current facility-administered medications for this encounter.   Current Outpatient Medications:  .  DULoxetine (CYMBALTA) 60 MG capsule, Take 60 mg by mouth daily., Disp: , Rfl:  .  tiZANidine (ZANAFLEX) 4 MG tablet, Take 4 mg by mouth every 6 (six) hours as needed for muscle spasms., Disp:  , Rfl:  .  cephALEXin (KEFLEX) 500 MG capsule, Take 1 capsule (500 mg total) by mouth 2 (two) times daily., Disp: 14 capsule, Rfl: 0 .  phenazopyridine (PYRIDIUM) 200 MG tablet, Take 1 tablet (200 mg total) by mouth 3 (three) times daily as needed for pain., Disp: 6 tablet, Rfl: 0 .  scopolamine (TRANSDERM-SCOP, 1.5 MG,) 1 MG/3DAYS, Place 1 patch (1.5 mg total) onto the skin every 3 (three) days., Disp: 15 patch, Rfl: 12  Allergies  Allergen Reactions  . Carvedilol Palpitations  . Latex   . Oxycodone Other (See Comments)    fainting  . Vicodin [Hydrocodone-Acetaminophen]      ROS  As noted in HPI.   Physical Exam  BP 119/75 (BP Location: Left Arm)   Pulse 70   Temp 99 F (37.2 C) (Oral)   Resp 16   Ht 5' 5.5" (1.664 m)   Wt 190 lb (86.2 kg)   LMP 09/10/2017 (Approximate)   SpO2 99%   BMI 31.14 kg/m   Constitutional: Well developed, well nourished, no acute distress Eyes:  EOMI, conjunctiva normal bilaterally HENT: Normocephalic, atraumatic,mucus membranes moist Respiratory: Normal inspiratory effort Cardiovascular: Normal rate GI: nondistended positive suprapubic tenderness.  Mild right flank tenderness. Back: No CVAT skin: No rash, skin intact Musculoskeletal: no deformities Neurologic: Alert & oriented x 3, no focal neuro deficits Psychiatric: Speech and behavior appropriate   ED Course   Medications - No data to display  Orders Placed This Encounter  Procedures  . Urine culture    Standing Status:   Standing    Number of Occurrences:   1    Order Specific Question:   List patient's active antibiotics    Answer:   macrobid    Order Specific Question:   Patient immune status    Answer:   Normal  . Urinalysis, Complete w Microscopic    Standing Status:   Standing    Number of Occurrences:   1    Results for orders placed or performed during the hospital encounter of 09/17/17 (from the past 24 hour(s))  Urinalysis, Complete w Microscopic     Status:  Abnormal   Collection Time: 09/17/17  5:28 PM  Result Value Ref Range   Color, Urine YELLOW YELLOW   APPearance CLOUDY (A) CLEAR   Specific Gravity, Urine 1.025 1.005 - 1.030   pH 6.0 5.0 - 8.0   Glucose, UA NEGATIVE NEGATIVE mg/dL   Hgb urine dipstick SMALL (A) NEGATIVE   Bilirubin Urine NEGATIVE NEGATIVE   Ketones, ur TRACE (A) NEGATIVE mg/dL   Protein, ur 30 (A) NEGATIVE mg/dL   Nitrite NEGATIVE NEGATIVE   Leukocytes, UA MODERATE (A) NEGATIVE   Squamous Epithelial / LPF 0-5 0 - 5   WBC, UA >50 0 - 5 WBC/hpf   RBC / HPF 11-20 0 - 5 RBC/hpf   Bacteria, UA RARE (A) NONE SEEN   No results found.  ED Clinical Impression  Acute cystitis with hematuria   ED Assessment/Plan  Review of urine cultures reviewed as noted in HPI. H&P, UA consistent with a UTI.  Will send home with Keflex, Pyridium as her last UTI was a Proteus UTI that was sensitive to Keflex but resistant to Macrobid.  Sending urine off for culture to confirm antibiotic choice.  Advised her to contact her urologist or her primary care physician and see if there is an antibiotic that she can take after intercourse to help prevent UTIs.  Discussed labs, MDM, treatment plan, and plan for follow-up with patient. Discussed sn/sx that should prompt return to the ED. patient agrees with plan.   Meds ordered this encounter  Medications  . phenazopyridine (PYRIDIUM) 200 MG tablet    Sig: Take 1 tablet (200 mg total) by mouth 3 (three) times daily as needed for pain.    Dispense:  6 tablet    Refill:  0  . DISCONTD: nitrofurantoin, macrocrystal-monohydrate, (MACROBID) 100 MG capsule    Sig: Take 1 capsule (100 mg total) by mouth 2 (two) times daily. X 5 days    Dispense:  10 capsule    Refill:  0  . cephALEXin (KEFLEX) 500 MG capsule    Sig: Take 1 capsule (500 mg total) by mouth 2 (two) times daily.    Dispense:  14 capsule    Refill:  0    *This clinic note was created using Scientist, clinical (histocompatibility and immunogenetics). Therefore, there  may be occasional mistakes despite careful proofreading.   ?   Domenick Gong, MD 09/17/17 [phone]

## 2017-09-20 LAB — URINE CULTURE
Culture: 70000 — AB
Special Requests: NORMAL

## 2018-03-10 DIAGNOSIS — G4733 Obstructive sleep apnea (adult) (pediatric): Secondary | ICD-10-CM | POA: Diagnosis not present

## 2018-04-12 DIAGNOSIS — Z8679 Personal history of other diseases of the circulatory system: Secondary | ICD-10-CM | POA: Diagnosis not present

## 2018-04-12 DIAGNOSIS — Z45018 Encounter for adjustment and management of other part of cardiac pacemaker: Secondary | ICD-10-CM | POA: Diagnosis not present

## 2018-04-30 ENCOUNTER — Ambulatory Visit
Admission: EM | Admit: 2018-04-30 | Discharge: 2018-04-30 | Disposition: A | Discharge status: Home / Self Care | Payer: Commercial Managed Care - PPO

## 2018-04-30 DIAGNOSIS — Z111 Encounter for screening for respiratory tuberculosis: Secondary | ICD-10-CM

## 2018-04-30 NOTE — ED Triage Notes (Signed)
Patient in today for PPD placement for work. Patient denies any history of positive PPD. Patient instructed to return 48-72 hours to have PPD read. Patient verbalized understanding.  PPD placed left forearm @ 11:24am. Patient tolerated without incident.

## 2018-05-02 ENCOUNTER — Ambulatory Visit
Admission: EM | Admit: 2018-05-02 | Discharge: 2018-05-02 | Disposition: A | Discharge status: Home / Self Care | Payer: Commercial Managed Care - PPO | Attending: Family Medicine | Admitting: Family Medicine

## 2018-05-02 DIAGNOSIS — Z111 Encounter for screening for respiratory tuberculosis: Secondary | ICD-10-CM | POA: Diagnosis not present

## 2018-05-02 NOTE — ED Notes (Signed)
Patient PPD test was Negative 61mm in left forearm.

## 2018-05-03 DIAGNOSIS — L720 Epidermal cyst: Secondary | ICD-10-CM | POA: Diagnosis not present

## 2018-06-25 DIAGNOSIS — G44221 Chronic tension-type headache, intractable: Secondary | ICD-10-CM | POA: Diagnosis not present

## 2018-06-25 DIAGNOSIS — G43819 Other migraine, intractable, without status migrainosus: Secondary | ICD-10-CM | POA: Diagnosis not present

## 2018-06-25 DIAGNOSIS — Q210 Ventricular septal defect: Secondary | ICD-10-CM | POA: Diagnosis not present

## 2018-06-25 DIAGNOSIS — R42 Dizziness and giddiness: Secondary | ICD-10-CM | POA: Diagnosis not present

## 2018-06-25 DIAGNOSIS — Q203 Discordant ventriculoarterial connection: Secondary | ICD-10-CM | POA: Diagnosis not present

## 2018-07-01 DIAGNOSIS — Q210 Ventricular septal defect: Secondary | ICD-10-CM | POA: Diagnosis not present

## 2018-07-01 DIAGNOSIS — Z9889 Other specified postprocedural states: Secondary | ICD-10-CM | POA: Diagnosis not present

## 2018-07-01 DIAGNOSIS — Z8774 Personal history of (corrected) congenital malformations of heart and circulatory system: Secondary | ICD-10-CM | POA: Diagnosis not present

## 2018-07-01 DIAGNOSIS — Z09 Encounter for follow-up examination after completed treatment for conditions other than malignant neoplasm: Secondary | ICD-10-CM | POA: Diagnosis not present

## 2018-12-02 ENCOUNTER — Encounter: Payer: Self-pay | Admitting: Emergency Medicine

## 2018-12-02 ENCOUNTER — Other Ambulatory Visit: Payer: Self-pay

## 2018-12-02 ENCOUNTER — Ambulatory Visit
Admission: EM | Admit: 2018-12-02 | Discharge: 2018-12-02 | Disposition: A | Discharge status: Home / Self Care | Payer: Commercial Managed Care - PPO | Attending: Urgent Care | Admitting: Urgent Care

## 2018-12-02 DIAGNOSIS — R0981 Nasal congestion: Secondary | ICD-10-CM | POA: Diagnosis not present

## 2018-12-02 DIAGNOSIS — J029 Acute pharyngitis, unspecified: Secondary | ICD-10-CM | POA: Diagnosis not present

## 2018-12-02 DIAGNOSIS — R519 Headache, unspecified: Secondary | ICD-10-CM

## 2018-12-02 DIAGNOSIS — B349 Viral infection, unspecified: Secondary | ICD-10-CM

## 2018-12-02 DIAGNOSIS — Z7189 Other specified counseling: Secondary | ICD-10-CM

## 2018-12-02 DIAGNOSIS — Z20828 Contact with and (suspected) exposure to other viral communicable diseases: Secondary | ICD-10-CM

## 2018-12-02 DIAGNOSIS — Z20822 Contact with and (suspected) exposure to covid-19: Secondary | ICD-10-CM

## 2018-12-02 HISTORY — DX: Obstructive sleep apnea (adult) (pediatric): G47.33

## 2018-12-02 HISTORY — DX: Discordant ventriculoarterial connection: Q20.3

## 2018-12-02 HISTORY — DX: Personal history of (corrected) congenital malformations of heart and circulatory system: Z87.74

## 2018-12-02 HISTORY — DX: Chronic tension-type headache, not intractable: G44.229

## 2018-12-02 LAB — RAPID STREP SCREEN (MED CTR MEBANE ONLY): Streptococcus, Group A Screen (Direct): NEGATIVE

## 2018-12-02 NOTE — ED Provider Notes (Signed)
[place], [place]   Name: Melissa Mcpherson DOB: [birth year] MRN: [id number] CSN: [id number] PCP: Elon Alas, MD  Arrival date and time:  12/02/18 1934  Chief Complaint:  Sore Throat, Nasal Congestion, and Headache   NOTE: Prior to seeing the patient today, I have reviewed the triage nursing documentation and vital signs. Clinical staff has updated patient's PMH/PSHx, current medication list, and drug allergies/intolerances to ensure comprehensive history available to assist in medical decision making.   History:   HPI: Melissa Mcpherson is a 33 y.o. female who presents today with complaints of not feeling well since yesterday.  She has been experiencing myalgias, fatigue, sore throat, and congestion.  She has had an intermittent dry cough.  Patient denies any associated facial pain, however reports pain in her BILATERAL ears and a generalized headache. She denies that she has experienced any nausea, vomiting, diarrhea, or abdominal pain. She is eating and drinking well. Patient denies any perceived alterations to her sense of taste or smell.  Patient's daughter was sick with similar symptoms 2 to 3 weeks ago.  Patient advising the child was conservatively treated at home and has since recovered.  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 Despite her symptoms, patient has not taken any over the counter interventions to help improve/relieve her reported symptoms at home.   Past Medical History:  Diagnosis Date  . Chronic tension headaches   . IBS (irritable bowel syndrome)   . Lyme disease   . OSA on CPAP   . Pacemaker   . Status post patch closure of VSD    performed at 84 months of age  . TGA (transposition of great arteries)    s/p arterial switch at 16 months of age    Past Surgical History:   Procedure Laterality Date  . CARDIAC SURGERY    . CESAREAN SECTION    . CHOLECYSTECTOMY      History reviewed. No pertinent family history.  Social History   Tobacco Use  . Smoking status: Never Smoker  . Smokeless tobacco: Never Used  Substance Use Topics  . Alcohol use: No  . Drug use: No    There are no active problems to display for this patient.   Home Medications:    Current Meds  Medication Sig  . DULoxetine (CYMBALTA) 60 MG capsule Take 60 mg by mouth daily.  Marland Kitchen tiZANidine (ZANAFLEX) 4 MG tablet Take 4 mg by mouth every 6 (six) hours as needed for muscle spasms.    Allergies:   Carvedilol, Latex, Oxycodone, and Vicodin [hydrocodone-acetaminophen]  Review of Systems (ROS): Review of Systems  Constitutional: Positive for fatigue. Negative for fever.  HENT: Positive for congestion, ear pain and sore throat. Negative for postnasal drip, rhinorrhea, sinus pressure, sinus pain and sneezing.   Eyes: Positive for itching. Negative for pain, discharge and redness.  Respiratory: Positive for cough (intermittent and non-productive). Negative for chest tightness and shortness of breath.   Cardiovascular: Negative for chest pain and palpitations.  Gastrointestinal: Negative for abdominal pain, diarrhea, nausea and vomiting.  Genitourinary: Negative for dysuria, flank pain, frequency, hematuria and urgency.  Musculoskeletal: Positive for myalgias. Negative for arthralgias, back pain and neck pain.  Skin: Negative for color change, pallor and rash.  Neurological: Positive for headaches (chronic tension-type). Negative for dizziness, syncope and weakness.  Hematological: Negative for adenopathy.  Vital Signs: Today's Vitals   12/02/18 1940 12/02/18 1942 12/02/18 1944 12/02/18 2011  BP:   117/77   Pulse:   74   Resp:   18   Temp:   98.3 F (36.8 C)   TempSrc:   Oral   SpO2:   99%   Weight:  190 lb (86.2 kg)    Height:  5\' 5"  (1.651 m)    PainSc: 4    4      Physical Exam: Physical Exam  Constitutional: She is oriented to person, place, and time and well-developed, well-nourished, and in no distress. No distress.  HENT:  Head: Normocephalic and atraumatic.  Right Ear: Hearing, tympanic membrane, external ear and ear canal normal.  Left Ear: Hearing, tympanic membrane, external ear and ear canal normal.  Nose: Mucosal edema and rhinorrhea present. No sinus tenderness.  Mouth/Throat: Uvula is midline and mucous membranes are normal. Posterior oropharyngeal erythema present. No oropharyngeal exudate or posterior oropharyngeal edema.  Eyes: Pupils are equal, round, and reactive to light. Conjunctivae and EOM are normal.  Neck: Normal range of motion. Neck supple.  Cardiovascular: Normal rate, regular rhythm, normal heart sounds and intact distal pulses. Exam reveals no gallop and no friction rub.  No murmur heard. Pulmonary/Chest: Effort normal. No respiratory distress. She has no decreased breath sounds. She has no wheezes. She has no rhonchi. She has no rales.  Abdominal: Soft. Bowel sounds are normal. She exhibits no distension. There is no abdominal tenderness.  Musculoskeletal: Normal range of motion.  Neurological: She is alert and oriented to person, place, and time. She has normal sensation, normal strength, normal reflexes and intact cranial nerves. Gait normal.  Skin: Skin is warm and dry. No rash noted. She is not diaphoretic.  Psychiatric: Mood, memory, affect and judgment normal.  Nursing note and vitals reviewed.   Urgent Care Treatments / Results:   LABS: PLEASE NOTE: all labs that were ordered this encounter are listed, however only abnormal results are displayed. Labs Reviewed  RAPID STREP SCREEN ([facility] ONLY)  CULTURE, GROUP A STREP ([facility])  NOVEL CORONAVIRUS, NAA (HOSP ORDER, SEND-OUT TO REF LAB; TAT 18-24 HRS)    EKG: -None  RADIOLOGY: No results found.  PROCEDURES: Procedures  MEDICATIONS RECEIVED THIS  VISIT: Medications - No data to display  PERTINENT CLINICAL COURSE NOTES/UPDATES:   Initial Impression / Assessment and Plan / Urgent Care Course:  Pertinent labs & imaging results that were available during my care of the patient were personally reviewed by me and considered in my medical decision making (see lab/imaging section of note for values and interpretations).  Taila Tama HeadingsLeigh Roskelley is a 33 y.o. female who [facility] with complaints of Sore Throat, Nasal Congestion, and Headache   Patient overall well appearing and in no acute distress today in clinic. Presenting symptoms (see HPI) and exam as documented above. She presents with symptoms associated with SARS-CoV-2 (novel coronavirus). Discussed typical symptom constellation. Reviewed potential for infection and need for testing. Patient amenable to being tested. SARS-CoV-2 swab collected by certified clinical staff. Discussed variable turn around times associated with testing, as sw[facility] are [facility], and have been taking between 2-5 days to come back. She was advised to self quarantine, per [facility] DH[facility] guidelines, until negative results received.   Rapid streptococcal throat swab (-); reflex culture sent. No fevers; influenza testing deferred. Symptoms consistent with viral illness. I discussed with her that her symptoms are felt to  be viral in nature, thus antibiotics would not offer her any relief or improve his symptoms any faster than conservative symptomatic management. Discussed supportive care measures at home during acute phase of illness. Patient to rest as much as possible. She was encouraged to ensure adequate hydration (water and ORS) to prevent dehydration and electrolyte derangements. Patient may use APAP on an as needed basis for pain/fever.   Current clinical condition warrants patient being out of work in order to quarantine while waiting for testing results. She was provided with the  appropriate documentation to provide to her place of employment that will allow for her to RTW on 12/04/2018 with no restrictions. RTW is contingent on her SARS-CoV-2 test results being reviewed as negative. Given PUI status for SARS-CoV-2, notes provided for patient's children and husband to remain home until patient has received negative test results.   Discussed follow up with primary care physician in 1 week for re-evaluation. I have reviewed the follow up and strict return precautions for any new or worsening symptoms. Patient is aware of symptoms that would be deemed urgent/emergent, and would thus require further evaluation either here or in the emergency department. At the time of discharge, she verbalized understanding and consent with the discharge plan as it was reviewed with her. All questions were fielded by provider and/or clinic staff prior to patient discharge.    Final Clinical Impressions / Urgent Care Diagnoses:   Final diagnoses:  Viral illness  Encounter for laboratory testing for COVID-19 virus  Advice given about COVID-19 virus infection    New Prescriptions:  [place] Controlled Substance Registry consulted? Not Applicable  No orders of the defined types were placed in this encounter.   Recommended Follow up Care:  Patient encouraged to follow up with the following provider within the specified time frame, or sooner as dictated by the severity of her symptoms. As always, she was instructed that for any urgent/emergent care needs, she should seek care either here or in the emergency department for more immediate evaluation.  Follow-up Information    Zenaida Niece, MD In 1 week.   Specialty: Family Medicine Why: General reassessment of symptoms if not improving Contact information: [address] [place] [address] [phone]         NOTE: This note was prepared using Dragon dictation software along with smaller phrase technology. Despite my best  ability to proofread, there is the potential that transcriptional errors may still occur from this process, and are completely unintentional.   Verlee Monte, NP 12/03/18 2233

## 2018-12-02 NOTE — Discharge Instructions (Addendum)
It was very nice seeing you today in clinic. Thank you for entrusting me with your care.  ° °Rest and Increase fluid intake as much as possible. Water is always best, as sugar and caffeine containing fluids can cause you to become dehydrated. Try to incorporate electrolyte enriched fluids, such as Gatorade or Pedialyte, into your daily fluid intake.   ° °You were tested for SARS-CoV-2 (novel coronavirus) today. Testing is performed by an outside lab ([facility]) and has variable turn around times ranging between 2-5 days. Current recommendations from the the CDC and [place] DHHS require that you remain out of work in order to quarantine at home until negative test results are have been received. In the event that your test results are positive, you will be contacted with further directives. These measures are being implemented out of an abundance of caution to prevent transmission and spread during the current SARS-CoV-2 pandemic.  ° °Make arrangements to follow up with your regular doctor in 1 week for re-evaluation if not improving. If your symptoms/condition worsens, please seek follow up care either here or in the ER. Please remember, our [place] providers are "right here with you" when you need us.  ° °Again, it was my pleasure to take care of you today. Thank you for choosing our clinic. I hope that you start to feel better quickly.  ° °Velita Quirk, MSN, APRN, FNP-C, CEN °Advanced Practice Provider °[facility] °

## 2018-12-02 NOTE — ED Triage Notes (Signed)
Patient c/o headache and body aches, sore throat that started yesterday. Patient states she started having a runny nose today. Patient is wanting COVID testing.

## 2018-12-03 ENCOUNTER — Encounter: Payer: Self-pay | Admitting: Urgent Care

## 2018-12-04 LAB — NOVEL CORONAVIRUS, NAA (HOSP ORDER, SEND-OUT TO REF LAB; TAT 18-24 HRS): SARS-CoV-2, NAA: NOT DETECTED

## 2018-12-05 LAB — CULTURE, GROUP A STREP (THRC)

## 2019-01-08 ENCOUNTER — Other Ambulatory Visit: Payer: Self-pay

## 2019-01-08 ENCOUNTER — Ambulatory Visit
Admission: EM | Admit: 2019-01-08 | Discharge: 2019-01-08 | Disposition: A | Discharge status: Home / Self Care | Payer: Commercial Managed Care - PPO | Attending: Family Medicine | Admitting: Family Medicine

## 2019-01-08 ENCOUNTER — Encounter: Payer: Self-pay | Admitting: Emergency Medicine

## 2019-01-08 DIAGNOSIS — N3000 Acute cystitis without hematuria: Secondary | ICD-10-CM | POA: Diagnosis not present

## 2019-01-08 LAB — URINALYSIS, COMPLETE (UACMP) WITH MICROSCOPIC
Bilirubin Urine: NEGATIVE
Glucose, UA: NEGATIVE mg/dL
Ketones, ur: NEGATIVE mg/dL
Nitrite: NEGATIVE
Protein, ur: NEGATIVE mg/dL
Specific Gravity, Urine: 1.025 (ref 1.005–1.030)
pH: 5.5 (ref 5.0–8.0)

## 2019-01-08 MED ORDER — PHENAZOPYRIDINE HCL 200 MG PO TABS: 200.0000 mg | ORAL_TABLET | Freq: Three times a day (TID) | ORAL | 0 refills | Status: DC | PRN

## 2019-01-08 MED ORDER — SULFAMETHOXAZOLE-TRIMETHOPRIM 800-160 MG PO TABS: 1.0000 | ORAL_TABLET | Freq: Two times a day (BID) | ORAL | 0 refills | Status: AC

## 2019-01-08 NOTE — ED Triage Notes (Signed)
Patient c/o burning when urinating and urinary frequency that started 2 weeks ago.  Patient states that she took Cipro for a week from an old prescription.  Patient states that her symptoms have gotten worse over the past couple of days. Patient denies fevers.

## 2019-01-08 NOTE — Discharge Instructions (Addendum)
Medications as prescribed. ° °Take care ° °Dr. Saga Balthazar  °

## 2019-01-08 NOTE — ED Provider Notes (Signed)
[facility]    CSN: [id number] Arrival date & time: 01/08/19  1028  History   Chief Complaint Chief Complaint  Patient presents with  . Dysuria  . Urinary Frequency   HPI  33 year old female presents with urinary symptoms.  Patient reports that approximately 2 weeks ago she developed urinary symptoms.  She reports dysuria and urinary frequency.  She took an old prescription of Keflex.  She states that she took it 3 times a day for 5 days.  Symptoms resolved.  Patient reports that her symptoms recurred on Thursday.  She reports dysuria and urinary frequency.  She reports suprapubic pain.  No relieving factors.  No flank pain.  No fever.  No known inciting factor.  No other associated symptoms.  No other complaints.  PMH, Surgical Hx, Family Hx, Social History reviewed and updated as below.  Past Medical History:  Diagnosis Date  . Chronic tension headaches   . IBS (irritable bowel syndrome)   . Lyme disease   . OSA on CPAP   . Pacemaker   . Status post patch closure of VSD    performed at 442 months of age  . TGA (transposition of great arteries)    s/p arterial switch at 362 months of age   Past Surgical History:  Procedure Laterality Date  . CARDIAC SURGERY    . CESAREAN SECTION    . CHOLECYSTECTOMY      OB History   No obstetric history on file.    Home Medications    Prior to Admission medications   Medication Sig Start Date End Date Taking? Authorizing Provider  DULoxetine (CYMBALTA) 60 MG capsule Take 60 mg by mouth daily.   Yes [provider]  tiZANidine (ZANAFLEX) 4 MG tablet Take 4 mg by mouth every 6 (six) hours as needed for muscle spasms.   Yes [provider]  phenazopyridine (PYRIDIUM) 200 MG tablet Take 1 tablet (200 mg total) by mouth 3 (three) times daily as needed for pain. 01/08/19   Tommie Samsook, Libby Goehring G, DO  sulfamethoxazole-trimethoprim (BACTRIM DS) 800-160 MG tablet Take 1 tablet by mouth 2 (two) times daily for 7 days.  01/08/19 01/15/19  Tommie Samsook, Lashea Goda G, DO    Family History Family History  Problem Relation Age of Onset  . Hypertension Mother     Social History Social History   Tobacco Use  . Smoking status: Never Smoker  . Smokeless tobacco: Never Used  Substance Use Topics  . Alcohol use: No  . Drug use: No     Allergies   Carvedilol, Latex, Oxycodone, and Vicodin [hydrocodone-acetaminophen]   Review of Systems Review of Systems  Constitutional: Negative for fever.  Gastrointestinal: Positive for abdominal pain.  Genitourinary: Positive for dysuria and frequency.   Physical Exam Triage Vital Signs ED Triage Vitals  Enc Vitals Group     BP 01/08/19 1043 118/77     Pulse Rate 01/08/19 1043 72     Resp 01/08/19 1043 14     Temp 01/08/19 1043 98.5 F (36.9 C)     Temp Source 01/08/19 1043 Oral     SpO2 01/08/19 1043 99 %     Weight 01/08/19 1041 180 lb (81.6 kg)     Height 01/08/19 1041 5\' 5"  (1.651 m)     Head Circumference --      Peak Flow --      Pain Score 01/08/19 1041 0     Pain Loc --      Pain  Edu? --      Excl. in GC? --    Updated Vital Signs BP 118/77 (BP Location: Right Arm)   Pulse 72   Temp 98.5 F (36.9 C) (Oral)   Resp 14   Ht 5\' 5"  (1.651 m)   Wt 81.6 kg   LMP 12/11/2018 (Approximate)   SpO2 99%   BMI 29.95 kg/m   Visual Acuity Right Eye Distance:   Left Eye Distance:   Bilateral Distance:    Right Eye Near:   Left Eye Near:    Bilateral Near:     Physical Exam Constitutional:      General: She is not in acute distress.    Appearance: Normal appearance. She is not ill-appearing.  HENT:     Head: Normocephalic and atraumatic.  Eyes:     General:        Right eye: No discharge.        Left eye: No discharge.     Conjunctiva/sclera: Conjunctivae normal.  Cardiovascular:     Rate and Rhythm: Normal rate and regular rhythm.     Heart sounds: No murmur.  Pulmonary:     Effort: Pulmonary effort is normal.     Breath sounds: Normal  breath sounds. No wheezing, rhonchi or rales.  Abdominal:     General: There is no distension.     Palpations: Abdomen is soft.     Tenderness: There is no right CVA tenderness or left CVA tenderness.     Comments: Tender to palpation in the suprapubic region.  Neurological:     Mental Status: She is alert.  Psychiatric:        Mood and Affect: Mood normal.        Behavior: Behavior normal.    UC Treatments / Results  Labs (all labs ordered are listed, but only abnormal results are displayed) Labs Reviewed  URINALYSIS, COMPLETE (UACMP) WITH MICROSCOPIC - Abnormal; Notable for the following components:      Result Value   APPearance HAZY (*)    Hgb urine dipstick TRACE (*)    Leukocytes,Ua SMALL (*)    Bacteria, UA FEW (*)    All other components within normal limits  URINE CULTURE    EKG   Radiology No results found.  Procedures Procedures (including critical care time)  Medications Ordered in UC Medications - No data to display  Initial Impression / Assessment and Plan / UC Course  I have reviewed the triage vital signs and the nursing notes.  Pertinent labs & imaging results that were available during my care of the patient were reviewed by me and considered in my medical decision making (see chart for details).    33 year old female presents with suspected UTI.  Sending culture.  Placing on Bactrim.  Patient requested Pyridium as well.  Rx sent.  Final Clinical Impressions(s) / UC Diagnoses   Final diagnoses:  Acute cystitis without hematuria     Discharge Instructions     Medications as prescribed.  Take care  Dr. 31    ED Prescriptions    Medication Sig Dispense Auth. Provider   sulfamethoxazole-trimethoprim (BACTRIM DS) 800-160 MG tablet Take 1 tablet by mouth 2 (two) times daily for 7 days. 14 tablet Minah Axelrod G, DO   phenazopyridine (PYRIDIUM) 200 MG tablet Take 1 tablet (200 mg total) by mouth 3 (three) times daily as needed for pain. 6  tablet 03-24-2003, DO     PDMP not reviewed this encounter.  Coral Spikes, DO 01/08/19 1254

## 2019-01-10 LAB — URINE CULTURE: Culture: NO GROWTH

## 2020-03-23 ENCOUNTER — Ambulatory Visit: Payer: Self-pay

## 2020-08-21 ENCOUNTER — Ambulatory Visit
Admission: EM | Admit: 2020-08-21 | Discharge: 2020-08-21 | Disposition: A | Discharge status: Home / Self Care | Payer: Commercial Managed Care - PPO | Attending: Physician Assistant | Admitting: Physician Assistant

## 2020-08-21 ENCOUNTER — Inpatient Hospital Stay: Admit: 2020-08-21 | Discharge: 2020-08-21 | Disposition: A | Discharge status: Home / Self Care | Payer: Self-pay

## 2020-08-21 ENCOUNTER — Other Ambulatory Visit: Payer: Self-pay

## 2020-08-21 DIAGNOSIS — U071 COVID-19: Secondary | ICD-10-CM | POA: Diagnosis not present

## 2020-08-21 DIAGNOSIS — R5383 Other fatigue: Secondary | ICD-10-CM | POA: Diagnosis not present

## 2020-08-21 DIAGNOSIS — R112 Nausea with vomiting, unspecified: Secondary | ICD-10-CM | POA: Diagnosis not present

## 2020-08-21 LAB — POCT URINALYSIS DIP (DEVICE)
Bilirubin Urine: NEGATIVE
Glucose, UA: NEGATIVE mg/dL
Hgb urine dipstick: NEGATIVE
Ketones, ur: 40 mg/dL — AB
Nitrite: NEGATIVE
Protein, ur: 30 mg/dL — AB
Specific Gravity, Urine: 1.02 (ref 1.005–1.030)
Urobilinogen, UA: 0.2 mg/dL (ref 0.0–1.0)
pH: 7.5 (ref 5.0–8.0)

## 2020-08-21 MED ORDER — ONDANSETRON 8 MG PO TBDP: 8.0000 mg | ORAL_TABLET | Freq: Three times a day (TID) | ORAL | 0 refills | Status: AC | PRN

## 2020-08-21 NOTE — ED Triage Notes (Signed)
Patient states that she was covid symptoms on 08/08/2020. States that she tested negative but husband and kids were positive. States that she has been unable to eat, keep anything down, fatigue, body aches with dizziness. States that she has not been improving.

## 2020-08-21 NOTE — ED Provider Notes (Addendum)
[facility]    CSN: [id number] Arrival date & time: 08/21/20  1631      History   Chief Complaint Chief Complaint  Patient presents with   Fatigue    HPI Melissa Mcpherson is a 35 y.o. female presenting for proximately 2-week history of fatigue, feeling weak, decreased appetite, body aches, nausea and vomiting.  Patient also states she feels dizzy sometimes when standing up.  She has also had a cough and some headaches.  Patient states that her whole family had COVID-19 but she tested negative but did have the symptoms so she believes she still had COVID-19.  Patient says that she has been trying to drink Gatorade and fluids but she still has quite a bit of vomiting.  She says that her cough is very mild so she has not taken anything for that.  She denies any associated fevers, chest pain or breathing difficulty.  Admits to occasional abdominal cramping.  Denies any constipation or diarrhea.  Patient does have medical history significant for patient pacemaker placed when she was a child and tachycardia.  Patient has been vaccinated for COVID-19 x3.  Patient has no other complaints.  HPI  Past Medical History:  Diagnosis Date   Chronic tension headaches    IBS (irritable bowel syndrome)    Lyme disease    OSA on CPAP    Pacemaker    Status post patch closure of VSD    performed at 47 months of age   TGA (transposition of great arteries)    s/p arterial switch at 73 months of age    There are no problems to display for this patient.   Past Surgical History:  Procedure Laterality Date   CARDIAC SURGERY     CESAREAN SECTION     CHOLECYSTECTOMY      OB History   No obstetric history on file.      Home Medications    Prior to Admission medications   Medication Sig Start Date End Date Taking? Authorizing Provider  DULoxetine (CYMBALTA) 60 MG capsule Take 60 mg by mouth daily.   Yes [provider]  metoprolol succinate (TOPROL-XL) 25 MG 24 hr  tablet Take 25 mg by mouth every morning. 07/18/20  Yes [provider]  ondansetron (ZOFRAN ODT) 8 MG disintegrating tablet Take 1 tablet (8 mg total) by mouth every 8 (eight) hours as needed for up to 10 days for nausea or vomiting. 08/21/20 08/31/20 Yes Eusebio Friendly B, PA-C  tiZANidine (ZANAFLEX) 4 MG tablet Take 4 mg by mouth every 6 (six) hours as needed for muscle spasms.   Yes [provider]  phenazopyridine (PYRIDIUM) 200 MG tablet Take 1 tablet (200 mg total) by mouth 3 (three) times daily as needed for pain. 01/08/19   Tommie Sams, DO    Family History Family History  Problem Relation Age of Onset   Hypertension Mother     Social History Social History   Tobacco Use   Smoking status: Never   Smokeless tobacco: Never  Vaping Use   Vaping Use: Never used  Substance Use Topics   Alcohol use: No   Drug use: No     Allergies   Carvedilol, Latex, Oxycodone, and Vicodin [hydrocodone-acetaminophen]   Review of Systems Review of Systems  Constitutional:  Positive for fatigue. Negative for chills, diaphoresis and fever.  HENT:  Negative for congestion, ear pain, rhinorrhea, sinus pressure, sinus pain and sore throat.   Respiratory:  Negative for  cough and shortness of breath.   Gastrointestinal:  Positive for nausea and vomiting. Negative for abdominal pain.  Musculoskeletal:  Negative for arthralgias and myalgias.  Skin:  Negative for rash.  Neurological:  Positive for dizziness and weakness. Negative for headaches.  Hematological:  Negative for adenopathy.    Physical Exam Triage Vital Signs ED Triage Vitals  Enc Vitals Group     BP 08/21/20 1744 108/81     Pulse Rate 08/21/20 1744 65     Resp 08/21/20 1744 17     Temp 08/21/20 1744 98.3 F (36.8 C)     Temp Source 08/21/20 1744 Oral     SpO2 08/21/20 1744 100 %     Weight 08/21/20 1742 184 lb (83.5 kg)     Height 08/21/20 1742 5\' 5"  (1.651 m)     Head Circumference --      Peak Flow --       Pain Score 08/21/20 1741 4     Pain Loc --      Pain Edu? --      Excl. in GC? --    Orthostatic VS for the past 24 hrs:  BP- Lying Pulse- Lying BP- Sitting Pulse- Sitting BP- Standing at 0 minutes Pulse- Standing at 0 minutes  08/21/20 1820 116/67 58 120/74 68 109/72 84    Updated Vital Signs BP 108/81 (BP Location: Right Arm)   Pulse 65   Temp 98.3 F (36.8 C) (Oral)   Resp 17   Ht 5\' 5"  (1.651 m)   Wt 184 lb (83.5 kg)   LMP 07/29/2020   SpO2 100%   BMI 30.62 kg/m      Physical Exam Vitals and nursing note reviewed.  Constitutional:      General: She is not in acute distress.    Appearance: Normal appearance. She is ill-appearing. She is not toxic-appearing or diaphoretic.  HENT:     Head: Normocephalic and atraumatic.     Nose: Nose normal.     Mouth/Throat:     Mouth: Mucous membranes are moist.     Pharynx: Oropharynx is clear.  Eyes:     General: No scleral icterus.       Right eye: No discharge.        Left eye: No discharge.     Conjunctiva/sclera: Conjunctivae normal.  Cardiovascular:     Rate and Rhythm: Normal rate and regular rhythm.     Heart sounds: Normal heart sounds.  Pulmonary:     Effort: Pulmonary effort is normal. No respiratory distress.     Breath sounds: Normal breath sounds. No wheezing, rhonchi or rales.  Abdominal:     Palpations: Abdomen is soft.     Tenderness: There is abdominal tenderness (mild diffuse TTP).  Musculoskeletal:     Cervical back: Neck supple.  Skin:    General: Skin is dry.  Neurological:     General: No focal deficit present.     Mental Status: She is alert. Mental status is at baseline.     Motor: No weakness.     Gait: Gait normal.  Psychiatric:        Mood and Affect: Mood normal.        Behavior: Behavior normal.        Thought Content: Thought content normal.     UC Treatments / Results  Labs (all labs ordered are listed, but only abnormal results are displayed) Labs Reviewed  POCT URINALYSIS DIP  (DEVICE) - Abnormal; Notable  for the following components:      Result Value   Ketones, ur 40 (*)    Protein, ur 30 (*)    Leukocytes,Ua SMALL (*)    All other components within normal limits  POCT URINALYSIS DIPSTICK, ED / UC    EKG   Radiology No results found.  Procedures Procedures (including critical care time)  Medications Ordered in UC Medications - No data to display  Initial Impression / Assessment and Plan / UC Course  I have reviewed the triage vital signs and the nursing notes.  Pertinent labs & imaging results that were available during my care of the patient were reviewed by me and considered in my medical decision making (see chart for details).  35 year old female presenting for approximately 2-week history of fatigue, cough, nausea and vomiting.  Patient states that her family had COVID-19 and she believes she also had COVID-19.  She has oxygen for COVID-19 x3.  All vital signs are normal and stable.  Orthostatics performed and negative.  Exam significant for ill-appearing female but nontoxic.  She just appears fatigued.  She also has some mild diffuse abdominal tenderness.  Chest is clear to auscultation heart regular rate and rhythm.  Urinalysis has a specific gravity of 1.020  Patient appears to be having postviral fatigue.  Given that her family all had COVID-19 and her symptoms, she likely also does have COVID-19.  Vital signs are stable.  She is afebrile.  She is not complaining of any chest pain, severe cough or breathing issue.  No suspicion for pneumonia.  Patient may be mildly dehydrated but appears to be tolerating fluids okay.  Patient sent home with 8 mg ODT Zofran and encouraged her to increase fluids and electrolytes.  Advised her that she likely needs more rest and something to help with the nausea and vomiting so that she can easily hydrate well and follow a bland diet until she can start eating normally again.  Advise close monitoring and for any  acute worsening symptoms she is to go to ED.  Otherwise, advised her to follow-up with her PCP for any ongoing symptoms of the next week or so.  Patient agreeable to plan.   Final Clinical Impressions(s) / UC Diagnoses   Final diagnoses:  Fatigue, unspecified type  COVID-19  Nausea and vomiting, intractability of vomiting not specified, unspecified vomiting type     Discharge Instructions      Your vitals are reassuring and so is your exam.  You are not very dehydrated but you do need something for the nausea and vomiting and to get some nutrition.  I have sent in a nausea medication that you can take 3 times a day.  Bleed about 30 to 40 minutes and then try to eat some bland foods and push your fluids and electrolytes.  He should be seen again if you develop a fever, worsening cough, breathing difficulty or pain in your chest.  Go to the ED for any severe acute changes.     ED Prescriptions     Medication Sig Dispense Auth. Provider   ondansetron (ZOFRAN ODT) 8 MG disintegrating tablet Take 1 tablet (8 mg total) by mouth every 8 (eight) hours as needed for up to 10 days for nausea or vomiting. 20 tablet Gareth Morgan      PDMP not reviewed this encounter.   Shirlee Latch, PA-C 08/21/20 1845    Shirlee Latch, PA-C 08/21/20 1845

## 2020-08-21 NOTE — Discharge Instructions (Addendum)
Your vitals are reassuring and so is your exam.  You are not very dehydrated but you do need something for the nausea and vomiting and to get some nutrition.  I have sent in a nausea medication that you can take 3 times a day.  Bleed about 30 to 40 minutes and then try to eat some bland foods and push your fluids and electrolytes.  He should be seen again if you develop a fever, worsening cough, breathing difficulty or pain in your chest.  Go to the ED for any severe acute changes.

## 2021-07-26 ENCOUNTER — Ambulatory Visit
Admission: EM | Admit: 2021-07-26 | Discharge: 2021-07-26 | Disposition: A | Discharge status: Home / Self Care | Payer: Commercial Managed Care - PPO | Attending: Internal Medicine | Admitting: Internal Medicine

## 2021-07-26 DIAGNOSIS — R079 Chest pain, unspecified: Secondary | ICD-10-CM

## 2021-07-26 NOTE — ED Provider Notes (Signed)
[facility]    CSN: [id number] Arrival date & time: 07/26/21  1028      History   Chief Complaint Chief Complaint  Patient presents with   Nausea   Chest Pain   Gastroesophageal Reflux    HPI Melissa Mcpherson is a 36 y.o. female with a history of gastroesophageal reflux and status post pacemaker placement disease comes to urgent care with complaint of worsening indigestion over the past couple months.  Patient denies associated with chest pain.  No chest tightness.  She has not changed her dietary habits or medications.  No recent travel.  She has tried home remedies with no improvement in chest discomfort.  She has had some dizziness and diaphoresis with chest pain.Marland Kitchen   HPI  Past Medical History:  Diagnosis Date   Chronic tension headaches    IBS (irritable bowel syndrome)    Lyme disease    OSA on CPAP    Pacemaker    Status post patch closure of VSD    performed at 4 months of age   TGA (transposition of great arteries)    s/p arterial switch at 34 months of age    There are no problems to display for this patient.   Past Surgical History:  Procedure Laterality Date   CARDIAC SURGERY     CESAREAN SECTION     CHOLECYSTECTOMY      OB History   No obstetric history on file.      Home Medications    Prior to Admission medications   Medication Sig Start Date End Date Taking? Authorizing Provider  nadolol (CORGARD) 20 MG tablet Take 1 tablet by mouth daily. 06/19/21 06/19/22 Yes [provider]  DULoxetine (CYMBALTA) 60 MG capsule Take 60 mg by mouth daily.    [provider]  tiZANidine (ZANAFLEX) 4 MG tablet Take 4 mg by mouth every 6 (six) hours as needed for muscle spasms.    [provider]    Family History Family History  Problem Relation Age of Onset   Hypertension Mother     Social History Social History   Tobacco Use   Smoking status: Never   Smokeless tobacco: Never  Vaping Use   Vaping Use: Never  used  Substance Use Topics   Alcohol use: No   Drug use: No     Allergies   Carvedilol, Latex, Oxycodone, and Vicodin [hydrocodone-acetaminophen]   Review of Systems Review of Systems As per HPI  Physical Exam Triage Vital Signs ED Triage Vitals  Enc Vitals Group     BP 07/26/21 1102 116/80     Pulse Rate 07/26/21 1102 86     Resp 07/26/21 1102 18     Temp 07/26/21 1102 99 F (37.2 C)     Temp Source 07/26/21 1102 Oral     SpO2 07/26/21 1102 95 %     Weight 07/26/21 1102 175 lb (79.4 kg)     Height 07/26/21 1102 5\' 5"  (1.651 m)     Head Circumference --      Peak Flow --      Pain Score 07/26/21 1101 9     Pain Loc --      Pain Edu? --      Excl. in GC? --    No data found.  Updated Vital Signs BP 116/80 (BP Location: Left Arm)   Pulse 86   Temp 99 F (37.2 C) (Oral)   Resp 18   Ht 5\' 5"  (  1.651 m)   Wt 79.4 kg   LMP 07/12/2021   SpO2 95%   BMI 29.12 kg/m   Visual Acuity Right Eye Distance:   Left Eye Distance:   Bilateral Distance:    Right Eye Near:   Left Eye Near:    Bilateral Near:     Physical Exam Vitals and nursing note reviewed.  Constitutional:      Appearance: She is ill-appearing.  Cardiovascular:     Rate and Rhythm: Normal rate and regular rhythm.     Pulses:          Carotid pulses are 1+ on the right side and 1+ on the left side.      Radial pulses are 1+ on the right side and 1+ on the left side.  Pulmonary:     Breath sounds: No wheezing.  Musculoskeletal:     Cervical back: Normal range of motion.      UC Treatments / Results  Labs (all labs ordered are listed, but only abnormal results are displayed) Labs Reviewed - No data to display  EKG   Radiology No results found.  Procedures Procedures (including critical care time)  Medications Ordered in UC Medications - No data to display  Initial Impression / Assessment and Plan / UC Course  I have reviewed the triage vital signs and the nursing  notes.  Pertinent labs & imaging results that were available during my care of the patient were reviewed by me and considered in my medical decision making (see chart for details).     1.  Chest pain: EKG shows a paced rhythm Patient is advised to go to the emergency room to have further cardiac work-up given his family history of myocardial infarction. Patient aligns with the plan of care. Final Clinical Impressions(s) / UC Diagnoses   Final diagnoses:  Chest pain, unspecified type     Discharge Instructions      Please go to the emergency department for further evaluation.   ED Prescriptions   None    PDMP not reviewed this encounter.   Merrilee Jansky, MD 07/26/21 1550    Merrilee Jansky, MD 07/30/21 1626

## 2021-07-26 NOTE — ED Notes (Signed)
Patient is being discharged from the Urgent Care and sent to the [facility] Emergency Department via private vehicle . Per Dr. Lanny Cramp, patient is in need of higher level of care due to cardiac history and chest pain. Patient is aware and verbalizes understanding of plan of care.  Vitals:   07/26/21 1102  BP: 116/80  Pulse: 86  Resp: 18  Temp: 99 F (37.2 C)  SpO2: 95%

## 2021-07-26 NOTE — ED Triage Notes (Signed)
Pt c/o chest pain and nausea x23months. 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  Pt takes omeprazole and apple cider vinegar and states that nothing is helping the chest discomfort.  Pt states that the pain is different from normal acid reflux pain. Pt would have pain in her chest and then "sharp stomach pain".

## 2021-07-26 NOTE — Discharge Instructions (Addendum)
Please go to the emergency department for further evaluation.

## 2023-12-11 ENCOUNTER — Other Ambulatory Visit: Payer: Self-pay | Admitting: Family Medicine

## 2023-12-11 DIAGNOSIS — Z1231 Encounter for screening mammogram for malignant neoplasm of breast: Secondary | ICD-10-CM

## 2023-12-17 ENCOUNTER — Ambulatory Visit
Admission: RE | Admit: 2023-12-17 | Discharge: 2023-12-17 | Disposition: A | Source: Ambulatory Visit | Attending: Family Medicine

## 2023-12-17 DIAGNOSIS — Z1231 Encounter for screening mammogram for malignant neoplasm of breast: Secondary | ICD-10-CM
